# Patient Record
Sex: Female | Born: 1985 | Race: White | Hispanic: No | Marital: Married | Smoking: Never smoker
Health system: Southern US, Community
[De-identification: ages and names within clinical notes are randomized; demographics above are authoritative.]

## PROBLEM LIST (undated history)

## (undated) ENCOUNTER — Inpatient Hospital Stay (HOSPITAL_COMMUNITY): Payer: Self-pay

## (undated) DIAGNOSIS — B999 Unspecified infectious disease: Secondary | ICD-10-CM

## (undated) DIAGNOSIS — Z3490 Encounter for supervision of normal pregnancy, unspecified, unspecified trimester: Secondary | ICD-10-CM

## (undated) DIAGNOSIS — H469 Unspecified optic neuritis: Secondary | ICD-10-CM

## (undated) DIAGNOSIS — D219 Benign neoplasm of connective and other soft tissue, unspecified: Secondary | ICD-10-CM

## (undated) DIAGNOSIS — G35 Multiple sclerosis: Secondary | ICD-10-CM

## (undated) HISTORY — PX: OTHER SURGICAL HISTORY: SHX169

## (undated) HISTORY — PX: WISDOM TOOTH EXTRACTION: SHX21

---

## 2013-05-19 ENCOUNTER — Encounter (HOSPITAL_COMMUNITY): Payer: Self-pay | Admitting: *Deleted

## 2013-05-19 ENCOUNTER — Inpatient Hospital Stay (HOSPITAL_COMMUNITY)
Admission: AD | Admit: 2013-05-19 | Discharge: 2013-05-19 | Disposition: A | Discharge status: Home / Self Care | Payer: BC Managed Care – PPO | Source: Ambulatory Visit | Attending: Obstetrics and Gynecology | Admitting: Obstetrics and Gynecology

## 2013-05-19 DIAGNOSIS — O219 Vomiting of pregnancy, unspecified: Secondary | ICD-10-CM

## 2013-05-19 DIAGNOSIS — O210 Mild hyperemesis gravidarum: Secondary | ICD-10-CM | POA: Insufficient documentation

## 2013-05-19 DIAGNOSIS — R109 Unspecified abdominal pain: Secondary | ICD-10-CM | POA: Insufficient documentation

## 2013-05-19 HISTORY — DX: Unspecified optic neuritis: H46.9

## 2013-05-19 LAB — URINALYSIS, ROUTINE W REFLEX MICROSCOPIC
Ketones, ur: 40 mg/dL — AB
Nitrite: NEGATIVE
Protein, ur: NEGATIVE mg/dL
Urobilinogen, UA: 0.2 mg/dL (ref 0.0–1.0)

## 2013-05-19 MED ORDER — DOXYLAMINE-PYRIDOXINE 10-10 MG PO TBEC: 2.0000 | DELAYED_RELEASE_TABLET | Freq: Every day | ORAL | Status: DC

## 2013-05-19 MED ORDER — LACTATED RINGERS IV SOLN
25.0000 mg | Freq: Once | INTRAVENOUS | Status: AC
  Administered 2013-05-19: 25 mg via INTRAVENOUS
  Filled 2013-05-19: qty 1

## 2013-05-19 MED ORDER — DEXTROSE 5 % IN LACTATED RINGERS IV BOLUS
1000.0000 mL | Freq: Once | INTRAVENOUS | Status: AC
  Administered 2013-05-19: 1000 mL via INTRAVENOUS

## 2013-05-19 MED ORDER — ONDANSETRON 8 MG PO TBDP: 8.0000 mg | ORAL_TABLET | Freq: Three times a day (TID) | ORAL | Status: DC | PRN

## 2013-05-19 MED ORDER — ONDANSETRON 8 MG/NS 50 ML IVPB
8.0000 mg | Freq: Once | INTRAVENOUS | Status: AC
  Administered 2013-05-19: 8 mg via INTRAVENOUS
  Filled 2013-05-19: qty 8

## 2013-05-19 MED ORDER — FAMOTIDINE IN NACL 20-0.9 MG/50ML-% IV SOLN
20.0000 mg | Freq: Once | INTRAVENOUS | Status: AC
  Administered 2013-05-19: 20 mg via INTRAVENOUS
  Filled 2013-05-19: qty 50

## 2013-05-19 MED ORDER — PROMETHAZINE HCL 12.5 MG RE SUPP: 12.5000 mg | Freq: Four times a day (QID) | RECTAL | Status: DC | PRN

## 2013-05-19 NOTE — MAU Note (Signed)
Pt given d/c instructions and verbalized understanding.  

## 2013-05-19 NOTE — MAU Provider Note (Signed)
History     CSN: [id number]  Arrival date and time: 05/19/13 0050   First Provider Initiated Contact with Patient 05/19/13 0156      Chief Complaint  Patient presents with  . Emesis  . Abdominal Pain   HPI  Emily Rhodes is a 27 y.o. G1P0 at [redacted]w[redacted]d who presents today with nausea and vomiting. She states that she has had vomiting for her entire pregnancy so far, but the last 3 days it has gotten worse. She has been taking zofran and phenergan, and states that they are not helping. She last took phenergan around 2100, and promptly threw it up. She is also having a lot of upper abdominal pain. She states that in the past she has taken OTC acid reflux medications that have helped, but she has not taken any recently. She was seen in the office 2 days ago, and diclegis was added to her medications, but she feels that she has been worse since starting that medicine.   Past Medical History  Diagnosis Date  . Optic neuritis     Past Surgical History  Procedure Laterality Date  . Spinal tap      History reviewed. No pertinent family history.  History  Substance Use Topics  . Smoking status: Never Smoker   . Smokeless tobacco: Not on file  . Alcohol Use: No    Allergies: No Known Allergies  Prescriptions prior to admission  Medication Sig Dispense Refill  . cholecalciferol (VITAMIN D) 1000 UNITS tablet Take 1,000 Units by mouth daily.      . ondansetron (ZOFRAN) 4 MG tablet Take 4 mg by mouth every 8 (eight) hours as needed for nausea.      . Prenatal Vit-Fe Fumarate-FA (MULTIVITAMIN-PRENATAL) 27-0.8 MG TABS Take 1 tablet by mouth daily at 12 noon.      . promethazine (PHENERGAN) 25 MG tablet Take 25 mg by mouth every 6 (six) hours as needed for nausea.      . vitamin B-12 (CYANOCOBALAMIN) 1000 MCG tablet Take 1,000 mcg by mouth daily.        ROS Physical Exam   Blood pressure 126/75, pulse 88, temperature 98.7 F (37.1 C), temperature source Oral, resp. rate 20, height 5\' 5"   (1.651 m), weight 92.08 kg (203 lb), last menstrual period 05/19/2013, SpO2 100.00%.  Physical Exam  Nursing note and vitals reviewed. Constitutional: She is oriented to person, place, and time. She appears well-developed and well-nourished.  Cardiovascular: Normal rate.   Respiratory: Effort normal.  GI: Soft. There is no tenderness.  Vomiting while I was in the room.   Neurological: She is alert and oriented to person, place, and time.  Skin: Skin is warm.  Psychiatric: She has a normal mood and affect.    MAU Course  Procedures  Results for orders placed during the hospital encounter of 05/19/13 (from the past 24 hour(s))  URINALYSIS, ROUTINE W REFLEX MICROSCOPIC     Status: Abnormal   Collection Time    05/19/13  1:17 AM      Result Value Range   Color, Urine YELLOW  YELLOW   APPearance CLEAR  CLEAR   Specific Gravity, Urine 1.025  1.005 - 1.030   pH 6.5  5.0 - 8.0   Glucose, UA NEGATIVE  NEGATIVE mg/dL   Hgb urine dipstick NEGATIVE  NEGATIVE   Bilirubin Urine NEGATIVE  NEGATIVE   Ketones, ur 40 (*) NEGATIVE mg/dL   Protein, ur NEGATIVE  NEGATIVE mg/dL   Urobilinogen, UA 0.2  0.0 - 1.0 mg/dL   Nitrite NEGATIVE  NEGATIVE   Leukocytes, UA TRACE (*) NEGATIVE  URINE MICROSCOPIC-ADD ON     Status: Abnormal   Collection Time    05/19/13  1:17 AM      Result Value Range   Squamous Epithelial / LPF FEW (*) RARE   WBC, UA 3-6  <3 WBC/hpf   Bacteria, UA FEW (*) RARE   Urine-Other MUCOUS PRESENT     0210: Spoke with Dr. Senaida Ores. Will IV hydrate and give IV antiemetics. Offer patient ODT and/or rectal phenergan to have at home.  Assessment and Plan   1. Nausea and vomiting in pregnancy prior to [redacted] weeks gestation      Medication List    STOP taking these medications       ondansetron 4 MG tablet  Commonly known as:  ZOFRAN     promethazine 25 MG tablet  Commonly known as:  PHENERGAN  Replaced by:  promethazine 12.5 MG suppository      TAKE these medications        cholecalciferol 1000 UNITS tablet  Commonly known as:  VITAMIN D  Take 1,000 Units by mouth daily.     Doxylamine-Pyridoxine 10-10 MG Tbec  Commonly known as:  DICLEGIS  Take 2 tablets by mouth at bedtime. If symtoms persist add 1 tablet in the morning starting on Day 3. If symptoms persist add 1 tablet in the afternoon on Day 4.     multivitamin-prenatal 27-0.8 MG Tabs  Take 1 tablet by mouth daily at 12 noon.     ondansetron 8 MG disintegrating tablet  Commonly known as:  ZOFRAN ODT  Take 1 tablet (8 mg total) by mouth every 8 (eight) hours as needed for nausea.     promethazine 12.5 MG suppository  Commonly known as:  PHENERGAN  Place 1 suppository (12.5 mg total) rectally every 6 (six) hours as needed for nausea.     vitamin B-12 1000 MCG tablet  Commonly known as:  CYANOCOBALAMIN  Take 1,000 mcg by mouth daily.       Discussed comfort measures and dietary changes Knows to return to MAU as needed if SX worsen or do not improve.   [name] 05/19/2013, 1:57 AM

## 2013-05-19 NOTE — MAU Note (Signed)
Pt reports she has not been vomiting x 3 days , upper abd pain

## 2013-05-20 LAB — URINE CULTURE: Colony Count: 15000

## 2013-05-26 LAB — OB RESULTS CONSOLE HEPATITIS B SURFACE ANTIGEN: Hepatitis B Surface Ag: NEGATIVE

## 2013-05-26 LAB — OB RESULTS CONSOLE RUBELLA ANTIBODY, IGM: Rubella: IMMUNE

## 2013-05-26 LAB — OB RESULTS CONSOLE RPR: RPR: NONREACTIVE

## 2013-05-26 LAB — OB RESULTS CONSOLE ABO/RH: RH TYPE: POSITIVE

## 2013-05-26 LAB — OB RESULTS CONSOLE ANTIBODY SCREEN: Antibody Screen: NEGATIVE

## 2013-05-26 LAB — OB RESULTS CONSOLE HIV ANTIBODY (ROUTINE TESTING): HIV: NONREACTIVE

## 2013-06-26 LAB — OB RESULTS CONSOLE GC/CHLAMYDIA
Chlamydia: NEGATIVE
Gonorrhea: NEGATIVE

## 2013-11-24 LAB — OB RESULTS CONSOLE GBS: GBS: POSITIVE

## 2013-11-30 ENCOUNTER — Encounter (HOSPITAL_COMMUNITY): Payer: Self-pay | Admitting: General Practice

## 2013-11-30 ENCOUNTER — Inpatient Hospital Stay (HOSPITAL_COMMUNITY)
Admission: AD | Admit: 2013-11-30 | Discharge: 2013-12-02 | DRG: 781 | Disposition: A | Discharge status: Home / Self Care | Payer: BC Managed Care – PPO | Source: Ambulatory Visit | Attending: Obstetrics and Gynecology | Admitting: Obstetrics and Gynecology

## 2013-11-30 ENCOUNTER — Inpatient Hospital Stay (HOSPITAL_COMMUNITY): Payer: BC Managed Care – PPO

## 2013-11-30 DIAGNOSIS — Z22330 Carrier of Group B streptococcus: Secondary | ICD-10-CM

## 2013-11-30 DIAGNOSIS — O9989 Other specified diseases and conditions complicating pregnancy, childbirth and the puerperium: Secondary | ICD-10-CM

## 2013-11-30 DIAGNOSIS — O99891 Other specified diseases and conditions complicating pregnancy: Secondary | ICD-10-CM | POA: Diagnosis present

## 2013-11-30 DIAGNOSIS — O3410 Maternal care for benign tumor of corpus uteri, unspecified trimester: Secondary | ICD-10-CM | POA: Diagnosis present

## 2013-11-30 DIAGNOSIS — M549 Dorsalgia, unspecified: Secondary | ICD-10-CM | POA: Diagnosis present

## 2013-11-30 DIAGNOSIS — O4700 False labor before 37 completed weeks of gestation, unspecified trimester: Secondary | ICD-10-CM | POA: Diagnosis present

## 2013-11-30 DIAGNOSIS — O2390 Unspecified genitourinary tract infection in pregnancy, unspecified trimester: Principal | ICD-10-CM

## 2013-11-30 DIAGNOSIS — O2303 Infections of kidney in pregnancy, third trimester: Secondary | ICD-10-CM | POA: Diagnosis present

## 2013-11-30 DIAGNOSIS — N12 Tubulo-interstitial nephritis, not specified as acute or chronic: Secondary | ICD-10-CM

## 2013-11-30 DIAGNOSIS — D259 Leiomyoma of uterus, unspecified: Secondary | ICD-10-CM | POA: Diagnosis present

## 2013-11-30 LAB — URINALYSIS, ROUTINE W REFLEX MICROSCOPIC
GLUCOSE, UA: NEGATIVE mg/dL
Ketones, ur: 40 mg/dL — AB
Leukocytes, UA: NEGATIVE
Nitrite: NEGATIVE
PH: 5.5 (ref 5.0–8.0)
Protein, ur: 30 mg/dL — AB
UROBILINOGEN UA: 0.2 mg/dL (ref 0.0–1.0)

## 2013-11-30 LAB — CBC WITH DIFFERENTIAL/PLATELET
Basophils Absolute: 0 10*3/uL (ref 0.0–0.1)
Basophils Relative: 0 % (ref 0–1)
EOS PCT: 0 % (ref 0–5)
Eosinophils Absolute: 0 10*3/uL (ref 0.0–0.7)
HCT: 33.9 % — ABNORMAL LOW (ref 36.0–46.0)
HEMOGLOBIN: 11.3 g/dL — AB (ref 12.0–15.0)
LYMPHS ABS: 0.9 10*3/uL (ref 0.7–4.0)
LYMPHS PCT: 6 % — AB (ref 12–46)
MCH: 26 pg (ref 26.0–34.0)
MCHC: 33.3 g/dL (ref 30.0–36.0)
MCV: 78.1 fL (ref 78.0–100.0)
MONOS PCT: 3 % (ref 3–12)
Monocytes Absolute: 0.4 10*3/uL (ref 0.1–1.0)
Neutro Abs: 12.9 10*3/uL — ABNORMAL HIGH (ref 1.7–7.7)
Neutrophils Relative %: 91 % — ABNORMAL HIGH (ref 43–77)
PLATELETS: 249 10*3/uL (ref 150–400)
RBC: 4.34 MIL/uL (ref 3.87–5.11)
RDW: 13.4 % (ref 11.5–15.5)
WBC: 14.2 10*3/uL — AB (ref 4.0–10.5)

## 2013-11-30 LAB — URINE MICROSCOPIC-ADD ON

## 2013-11-30 LAB — RPR: RPR: NONREACTIVE

## 2013-11-30 LAB — TYPE AND SCREEN
ABO/RH(D): A POS
Antibody Screen: NEGATIVE

## 2013-11-30 LAB — ABO/RH: ABO/RH(D): A POS

## 2013-11-30 MED ORDER — ZOLPIDEM TARTRATE 5 MG PO TABS
5.0000 mg | ORAL_TABLET | Freq: Every evening | ORAL | Status: DC | PRN
  Administered 2013-11-30: 5 mg via ORAL
  Filled 2013-11-30: qty 1

## 2013-11-30 MED ORDER — PRENATAL MULTIVITAMIN CH
1.0000 | ORAL_TABLET | Freq: Every day | ORAL | Status: DC
  Filled 2013-11-30 (×2): qty 1

## 2013-11-30 MED ORDER — LACTATED RINGERS IV SOLN
INTRAVENOUS | Status: DC
  Administered 2013-11-30: 125 mL/h via INTRAVENOUS
  Administered 2013-12-01: 11:00:00 via INTRAVENOUS

## 2013-11-30 MED ORDER — FENTANYL CITRATE 0.05 MG/ML IJ SOLN
50.0000 ug | INTRAMUSCULAR | Status: DC | PRN
  Administered 2013-11-30: 50 ug via INTRAVENOUS
  Filled 2013-11-30: qty 2

## 2013-11-30 MED ORDER — COMPLETENATE 29-1 MG PO CHEW
1.0000 | CHEWABLE_TABLET | Freq: Every day | ORAL | Status: DC
  Filled 2013-11-30 (×3): qty 1

## 2013-11-30 MED ORDER — PROMETHAZINE HCL 25 MG/ML IJ SOLN
12.5000 mg | Freq: Once | INTRAMUSCULAR | Status: AC
  Administered 2013-11-30: 12.5 mg via INTRAVENOUS
  Filled 2013-11-30: qty 1

## 2013-11-30 MED ORDER — LACTATED RINGERS IV SOLN
Freq: Once | INTRAVENOUS | Status: AC
  Administered 2013-11-30: 12:00:00 via INTRAVENOUS

## 2013-11-30 MED ORDER — ACETAMINOPHEN 325 MG PO TABS: 650.0000 mg | ORAL_TABLET | ORAL | Status: DC | PRN

## 2013-11-30 MED ORDER — PROMETHAZINE HCL 25 MG/ML IJ SOLN
12.5000 mg | INTRAMUSCULAR | Status: DC | PRN
  Administered 2013-11-30 – 2013-12-01 (×4): 12.5 mg via INTRAVENOUS
  Filled 2013-11-30 (×4): qty 1

## 2013-11-30 MED ORDER — HYDROMORPHONE HCL PF 1 MG/ML IJ SOLN
1.0000 mg | INTRAMUSCULAR | Status: DC | PRN
  Administered 2013-11-30 – 2013-12-02 (×9): 1 mg via INTRAVENOUS
  Filled 2013-11-30 (×9): qty 1

## 2013-11-30 MED ORDER — DOCUSATE SODIUM 100 MG PO CAPS
100.0000 mg | ORAL_CAPSULE | Freq: Every day | ORAL | Status: DC
Start: 2013-11-30 — End: 2013-12-02
  Administered 2013-12-01: 100 mg via ORAL
  Filled 2013-11-30 (×4): qty 1

## 2013-11-30 MED ORDER — HYDROMORPHONE HCL PF 1 MG/ML IJ SOLN
1.0000 mg | Freq: Once | INTRAMUSCULAR | Status: AC
  Administered 2013-11-30: 1 mg via INTRAVENOUS
  Filled 2013-11-30: qty 1

## 2013-11-30 MED ORDER — CALCIUM CARBONATE ANTACID 500 MG PO CHEW
2.0000 | CHEWABLE_TABLET | ORAL | Status: DC | PRN
  Administered 2013-12-01: 400 mg via ORAL
  Filled 2013-11-30: qty 1
  Filled 2013-11-30: qty 2

## 2013-11-30 MED ORDER — HYDROMORPHONE HCL PF 10 MG/ML IJ SOLN: 1.0000 mg/h | INTRAMUSCULAR | Status: DC | PRN

## 2013-11-30 MED ORDER — DEXTROSE 5 % IV SOLN
1.0000 g | Freq: Two times a day (BID) | INTRAVENOUS | Status: DC
  Administered 2013-11-30 – 2013-12-02 (×4): 1 g via INTRAVENOUS
  Filled 2013-11-30 (×5): qty 10

## 2013-11-30 NOTE — MAU Provider Note (Signed)
History     CSN: [id number]  Arrival date and time: 11/30/13 1056   First Provider Initiated Contact with Patient 11/30/13 1145      Chief Complaint  Patient presents with  . Flank Pain  . Nausea  . Emesis   HPI This is a 28 y.o. female at [redacted]w[redacted]d who presents with c/o Left flank pain, nause and vomiting.  Also has some abdominal tightness, but no abdominal pain. Denies fever or bleeding. Sent for evaluation by Dr Melba Coon.  RN Note: Pt sent over to MAU by Dr. Melba Coon d/t c/o left sided back pain, nausea and vomiting, and all over abdominal discomfort she describes as tightness.      OB History   Grav Para Term Preterm Abortions TAB SAB Ect Mult Living   1               Past Medical History  Diagnosis Date  . Optic neuritis     Past Surgical History  Procedure Laterality Date  . Spinal tap      History reviewed. No pertinent family history.  History  Substance Use Topics  . Smoking status: Never Smoker   . Smokeless tobacco: Not on file  . Alcohol Use: No    Allergies: No Known Allergies  Prescriptions prior to admission  Medication Sig Dispense Refill  . ferrous sulfate 325 (65 FE) MG tablet Take 325 mg by mouth daily with breakfast.      . omeprazole (PRILOSEC) 40 MG capsule Take 40 mg by mouth daily.        Review of Systems  Constitutional: Negative for fever and chills.  Gastrointestinal: Positive for nausea, vomiting and abdominal pain. Negative for diarrhea and constipation.  Musculoskeletal: Positive for back pain (left flank).  Neurological: Negative for headaches.   Physical Exam   Blood pressure 122/70, pulse 71, temperature 97.7 F (36.5 C), temperature source Oral, resp. rate 20, height 5\' 6"  (1.676 m), weight 105.688 kg (233 lb), last menstrual period 05/19/2013.  Physical Exam  Constitutional: She is oriented to person, place, and time. She appears well-developed and well-nourished. No distress.  HENT:  Head: Normocephalic.   Cardiovascular: Normal rate.   Respiratory: Effort normal.  GI: Soft. She exhibits no distension. There is no tenderness. There is no rebound and no guarding.  + CVA tenderness on left mid back, more so under CVA, below ribcage   Musculoskeletal: Normal range of motion.  Neurological: She is alert and oriented to person, place, and time.  Skin: Skin is warm and dry.  Psychiatric: She has a normal mood and affect.   Fetal heart rate reactive Irregular contractions  MAU Course  Procedures  MDM CBC, diff, IV hydration, analgesia and Renal US. Results for orders placed during the hospital encounter of 11/30/13 (from the past 72 hour(s))  URINALYSIS, ROUTINE W REFLEX MICROSCOPIC     Status: Abnormal   Collection Time    11/30/13 11:40 AM      Result Value Range   Color, Urine YELLOW  YELLOW   APPearance CLEAR  CLEAR   Specific Gravity, Urine >1.030 (*) 1.005 - 1.030   pH 5.5  5.0 - 8.0   Glucose, UA NEGATIVE  NEGATIVE mg/dL   Hgb urine dipstick LARGE (*) NEGATIVE   Bilirubin Urine SMALL (*) NEGATIVE   Ketones, ur 40 (*) NEGATIVE mg/dL   Protein, ur 30 (*) NEGATIVE mg/dL   Urobilinogen, UA 0.2  0.0 - 1.0 mg/dL   Nitrite NEGATIVE  NEGATIVE  Leukocytes, UA NEGATIVE  NEGATIVE  URINE MICROSCOPIC-ADD ON     Status: Abnormal   Collection Time    11/30/13 11:40 AM      Result Value Range   Squamous Epithelial / LPF MANY (*) RARE   WBC, UA 0-2  <3 WBC/hpf   RBC / HPF 7-10  <3 RBC/hpf   Bacteria, UA MANY (*) RARE   Crystals CA OXALATE CRYSTALS (*) NEGATIVE   Urine-Other MUCOUS PRESENT     Comment: AMORPHOUS URATES/PHOSPHATES  CBC WITH DIFFERENTIAL     Status: Abnormal   Collection Time    11/30/13 12:05 PM      Result Value Range   WBC 14.2 (*) 4.0 - 10.5 K/uL   RBC 4.34  3.87 - 5.11 MIL/uL   Hemoglobin 11.3 (*) 12.0 - 15.0 g/dL   HCT 33.9 (*) 36.0 - 46.0 %   MCV 78.1  78.0 - 100.0 fL   MCH 26.0  26.0 - 34.0 pg   MCHC 33.3  30.0 - 36.0 g/dL   RDW 13.4  11.5 - 15.5 %    Platelets 249  150 - 400 K/uL   Neutrophils Relative % 91 (*) 43 - 77 %   Neutro Abs 12.9 (*) 1.7 - 7.7 K/uL   Lymphocytes Relative 6 (*) 12 - 46 %   Lymphs Abs 0.9  0.7 - 4.0 K/uL   Monocytes Relative 3  3 - 12 %   Monocytes Absolute 0.4  0.1 - 1.0 K/uL   Eosinophils Relative 0  0 - 5 %   Eosinophils Absolute 0.0  0.0 - 0.7 K/uL   Basophils Relative 0  0 - 1 %   Basophils Absolute 0.0  0.0 - 0.1 K/uL   US Renal  11/30/2013   CLINICAL DATA:  Left flank pain.  Thirty-three weeks pregnant.  EXAM: RENAL/URINARY TRACT ULTRASOUND COMPLETE  COMPARISON:  None.  FINDINGS: Right Kidney:  Length: 13.1 cm. Echogenicity within normal limits. No mass or hydronephrosis visualized.  Left Kidney:  Length: 13.2 cm. Echogenicity within normal limits. No mass or hydronephrosis visualized.  Bladder:  Urinary bladder is not well visualized. Intrauterine fetus is noted in cephalic presentation.  IMPRESSION: Normal sonographic appearance of both kidneys. No evidence hydronephrosis.   Electronically Signed   By: Earle Gell M.D.   On: 11/30/2013 13:31    Assessment and Plan  A:  SIUP at [redacted]w[redacted]d      Presumed Pyelonephritis  P:  Admit per Dr Melba Coon       Rocephin  Hansel Feinstein 11/30/2013, 12:11 PM

## 2013-11-30 NOTE — MAU Note (Signed)
Pt sent over to MAU by Dr. Melba Coon d/t c/o left sided back pain, nausea and vomiting, and all over abdominal discomfort she describes as tightness.

## 2013-11-30 NOTE — H&P (Signed)
Emily Rhodes is a 28 y.o. female G1P0 at 51 wk, .who presents to the office with crampy, colicky back pain since this am.  Also irregular runs of ctx.  +FM, no LOF, no VB.  PNC relatively uncomplicated - pt with optic neuritis, negative MRI, ? MS negative w/u.  MD at [facility] ok'd to push.  Also has small 2x3 cm anterior fibroid.  In MAU many bacteria in urine, cx P.  Blood in urine in office.    Maternal Medical History:  Contractions: Frequency: irregular.    Fetal activity: Perceived fetal activity is normal.    Prenatal Complications - Diabetes: none.    OB History   Grav Para Term Preterm Abortions TAB SAB Ect Mult Living   1 0 0 0 0 0 0 0 0 0     G1 present No abn pap, no STD  Past Medical History  Diagnosis Date  . Optic neuritis    Past Surgical History  Procedure Laterality Date  . Spinal tap     Family History: family history is not on file. Social History:  reports that she has never smoked. She does not have any smokeless tobacco history on file. She reports that she does not drink alcohol or use illicit drugs. Meds: PNV All: Bee venom, Tape, NKDA   Prenatal Transfer Tool  Maternal Diabetes: No Genetic Screening: Declined Maternal Ultrasounds/Referrals: Normal Fetal Ultrasounds or other Referrals:  None Maternal Substance Abuse:  No Significant Maternal Medications:  None Significant Maternal Lab Results:  Lab values include: Group B Strep positive Other Comments:  failed 1hr glucola, nl 3 hr GTT  Review of Systems  HENT: Negative.   Eyes: Negative.   Respiratory: Negative.   Cardiovascular: Negative.   Gastrointestinal: Negative.   Genitourinary: Positive for flank pain.  Musculoskeletal: Positive for myalgias.  Skin: Negative.   Neurological: Negative.   Psychiatric/Behavioral: Negative.       Blood pressure 122/68, pulse 92, temperature 99.4 F (37.4 C), temperature source Oral, resp. rate 20, height 5\' 6"  (1.676 m), weight 105.688 kg (233 lb), last  menstrual period 05/19/2013. Maternal Exam:  Uterine Assessment: Contraction frequency is irregular.   Abdomen: Fundal height is appropriate forgestation.   Estimated fetal weight is 5-6#.   Fetal presentation: vertex  Introitus: Normal vulva. Normal vagina.  Pelvis: adequate for delivery.   Cervix: Cervix evaluated by digital exam.     Physical Exam  Constitutional: She is oriented to person, place, and time. She appears well-developed and well-nourished.  HENT:  Head: Normocephalic and atraumatic.  Cardiovascular: Normal rate and regular rhythm.   Respiratory: Effort normal and breath sounds normal. No respiratory distress. She has no wheezes.  GI: Soft. Bowel sounds are normal. She exhibits no distension. There is no tenderness.  CVAT  Musculoskeletal: Normal range of motion.  Neurological: She is alert and oriented to person, place, and time.  Skin: Skin is warm and dry.  Psychiatric: She has a normal mood and affect. Her behavior is normal.    Prenatal labs: ABO, Rh: --/--/A POS, A POS (01/22 1536) Antibody: NEG (01/22 1536) Rubella: Immune (07/18 0000) RPR: Nonreactive (07/18 0000)  HBsAg: Negative (07/18 0000)  HIV: Non-reactive (07/18 0000)  GBS: Positive (01/16 0000)   Hgb 12.6/Pap WNL/Ur Cx neg/GCneg/ Chl neg/ glucola 147/ nl 3 hr GTT/ Plt 264K  [place] nl anat, ant plac, cwd, female, 2x3 ant fibroid. Assessment/Plan: 27yo G1P0 at 15 with pyelo admitted for abx and pain management Dilaudid 1 q 3hr Rocephin  1 q 12 Close monitoring   BOVARD,Quinita Kostelecky 11/30/2013, 8:10 PM

## 2013-12-01 MED ORDER — SODIUM CHLORIDE 0.9 % IV SOLN
INTRAVENOUS | Status: DC
  Administered 2013-12-01: 19:00:00 via INTRAVENOUS

## 2013-12-01 NOTE — Progress Notes (Signed)
Pt off the monitor after reassurring FHR  

## 2013-12-01 NOTE — Progress Notes (Signed)
Pt off the monitor.

## 2013-12-01 NOTE — Progress Notes (Signed)
Patient ID: Emily Rhodes, female   DOB: [birth year], 28 y.o.   MRN: [id number]  +FM, no LOF, no VB, occ ctx; intermittent, colicky back pain - helped with dilaudid.  AFVSS ( Tm =100.1 ) gen NAD FHTs 140's mod var, category 1 toco occ, runs  SVE 1.5  27yo G1P0 at 36+ with likely pyelo being treated with pyelo with Rocephin Dilaudid for pain Continue close monitoring

## 2013-12-02 MED ORDER — CEPHALEXIN 500 MG PO CAPS: 500.0000 mg | ORAL_CAPSULE | Freq: Three times a day (TID) | ORAL | Status: DC

## 2013-12-02 MED ORDER — OXYCODONE-ACETAMINOPHEN 5-325 MG PO TABS: 1.0000 | ORAL_TABLET | Freq: Four times a day (QID) | ORAL | Status: DC | PRN

## 2013-12-02 NOTE — Progress Notes (Signed)
Pt given discharge instructions and verlbalizes understanding

## 2013-12-02 NOTE — Discharge Summary (Signed)
Physician Discharge Summary  Patient ID: Emily Rhodes MRN: [id number] DOB/AGE: 04-May-1986 27 y.o.  Admit date: 11/30/2013 Discharge date: 12/02/2013  Admission Diagnoses:  IUP at 36+ weeks, possible pyelonephritis  Discharge Diagnoses:  Same Active Problems:   Pyelonephritis complicating pregnancy in third trimester, antepartum   Discharged Condition: good  Hospital Course: Pt admitted by Dr. Melba Coon with left back/flank pain and treated for possible pyelonephritis with Rocephin.  Tmax 100.1, no urine culture in computer.  Pain gradually improved, felt to be stable enough to continue antibiotics and pain meds at home.  Baby reactive throughout stay.  Consults: None  Significant Diagnostic Studies: radiology: Ultrasound: normal renal ultrasound  Treatments: IV hydration and antibiotics: ceftriaxone  Discharge Exam: Blood pressure 117/72, pulse 95, temperature 99 F (37.2 C), temperature source Oral, resp. rate 18, height 5\' 6"  (1.676 m), weight 105.688 kg (233 lb), last menstrual period 05/19/2013. General appearance: alert  Disposition: 01-Home or Self Care  Discharge Orders   Future Orders Complete By Expires   Discharge activity:  No Restrictions  As directed    Discharge diet:  No restrictions  As directed    Fetal Kick Count:  Lie on our left side for one hour after a meal, and count the number of times your baby kicks.  If it is less than 5 times, get up, move around and drink some juice.  Repeat the test 30 minutes later.  If it is still less than 5 kicks in an hour, notify your doctor.  As directed    No sexual activity restrictions  As directed        Medication List         cephALEXin 500 MG capsule  Commonly known as:  KEFLEX  Take 1 capsule (500 mg total) by mouth 3 (three) times daily.     ferrous sulfate 325 (65 FE) MG tablet  Take 325 mg by mouth daily with breakfast.     omeprazole 40 MG capsule  Commonly known as:  PRILOSEC  Take 40 mg by mouth daily.      oxyCODONE-acetaminophen 5-325 MG per tablet  Commonly known as:  ROXICET  Take 1-2 tablets by mouth every 6 (six) hours as needed for severe pain.           Follow-up Information   Please follow up. (As scheduled)       Signed: Ulyses Panico D 12/02/2013, 10:26 AM

## 2013-12-02 NOTE — Progress Notes (Signed)
HD #3, #[redacted]W[redacted]D, ?pyelo  Feels better, still some left low back pain with activity Afeb, VSS FHT- Reactive NSTs Fundus NT Back- no CVAT, tender left lower back/flank It does not appear that a urine culture was done Will d/c home on Keflex to finish 7 day course of antibiotics to cover for possible pyelo, continue Percocet prn

## 2013-12-02 NOTE — Progress Notes (Signed)
Pt off the monitor after reassurring FHr

## 2013-12-02 NOTE — Discharge Instructions (Signed)
Call for increasing pain or temp >100.3

## 2013-12-04 ENCOUNTER — Encounter (HOSPITAL_COMMUNITY): Payer: Self-pay | Admitting: *Deleted

## 2013-12-04 ENCOUNTER — Inpatient Hospital Stay (HOSPITAL_COMMUNITY)
Admission: AD | Admit: 2013-12-04 | Discharge: 2013-12-05 | DRG: 781 | Disposition: A | Discharge status: Home / Self Care | Payer: BC Managed Care – PPO | Source: Ambulatory Visit | Attending: Obstetrics and Gynecology | Admitting: Obstetrics and Gynecology

## 2013-12-04 ENCOUNTER — Inpatient Hospital Stay (HOSPITAL_COMMUNITY): Payer: BC Managed Care – PPO

## 2013-12-04 DIAGNOSIS — O4700 False labor before 37 completed weeks of gestation, unspecified trimester: Secondary | ICD-10-CM | POA: Diagnosis present

## 2013-12-04 DIAGNOSIS — O2390 Unspecified genitourinary tract infection in pregnancy, unspecified trimester: Principal | ICD-10-CM | POA: Diagnosis present

## 2013-12-04 DIAGNOSIS — O2303 Infections of kidney in pregnancy, third trimester: Secondary | ICD-10-CM

## 2013-12-04 DIAGNOSIS — R1031 Right lower quadrant pain: Secondary | ICD-10-CM | POA: Diagnosis present

## 2013-12-04 DIAGNOSIS — O26839 Pregnancy related renal disease, unspecified trimester: Secondary | ICD-10-CM | POA: Diagnosis present

## 2013-12-04 DIAGNOSIS — N1330 Unspecified hydronephrosis: Secondary | ICD-10-CM | POA: Diagnosis present

## 2013-12-04 DIAGNOSIS — N12 Tubulo-interstitial nephritis, not specified as acute or chronic: Secondary | ICD-10-CM | POA: Diagnosis present

## 2013-12-04 HISTORY — DX: Unspecified infectious disease: B99.9

## 2013-12-04 HISTORY — DX: Benign neoplasm of connective and other soft tissue, unspecified: D21.9

## 2013-12-04 LAB — URINALYSIS, ROUTINE W REFLEX MICROSCOPIC
BILIRUBIN URINE: NEGATIVE
Glucose, UA: NEGATIVE mg/dL
Ketones, ur: NEGATIVE mg/dL
Nitrite: NEGATIVE
PH: 6 (ref 5.0–8.0)
PROTEIN: NEGATIVE mg/dL
Specific Gravity, Urine: >1.03 — ABNORMAL HIGH (ref 1.005–1.030)
Urobilinogen, UA: 1 mg/dL (ref 0.0–1.0)

## 2013-12-04 LAB — URINE MICROSCOPIC-ADD ON

## 2013-12-04 LAB — COMPREHENSIVE METABOLIC PANEL
ALK PHOS: 168 U/L — AB (ref 39–117)
ALT: 9 U/L (ref 0–35)
AST: 13 U/L (ref 0–37)
Albumin: 2.6 g/dL — ABNORMAL LOW (ref 3.5–5.2)
BILIRUBIN TOTAL: 0.4 mg/dL (ref 0.3–1.2)
BUN: 6 mg/dL (ref 6–23)
CHLORIDE: 102 meq/L (ref 96–112)
CO2: 24 meq/L (ref 19–32)
Calcium: 9.4 mg/dL (ref 8.4–10.5)
Creatinine, Ser: 0.85 mg/dL (ref 0.50–1.10)
GLUCOSE: 83 mg/dL (ref 70–99)
POTASSIUM: 4.7 meq/L (ref 3.7–5.3)
Sodium: 140 mEq/L (ref 137–147)
TOTAL PROTEIN: 6.6 g/dL (ref 6.0–8.3)

## 2013-12-04 LAB — CBC
HEMATOCRIT: 33.7 % — AB (ref 36.0–46.0)
HEMOGLOBIN: 11 g/dL — AB (ref 12.0–15.0)
MCH: 25.5 pg — ABNORMAL LOW (ref 26.0–34.0)
MCHC: 32.6 g/dL (ref 30.0–36.0)
MCV: 78.2 fL (ref 78.0–100.0)
Platelets: 273 10*3/uL (ref 150–400)
RBC: 4.31 MIL/uL (ref 3.87–5.11)
RDW: 13.8 % (ref 11.5–15.5)
WBC: 11.3 10*3/uL — AB (ref 4.0–10.5)

## 2013-12-04 MED ORDER — ACETAMINOPHEN 325 MG PO TABS: 650.0000 mg | ORAL_TABLET | ORAL | Status: DC | PRN

## 2013-12-04 MED ORDER — PANTOPRAZOLE SODIUM 40 MG PO TBEC
40.0000 mg | DELAYED_RELEASE_TABLET | Freq: Every day | ORAL | Status: DC
  Administered 2013-12-04 – 2013-12-05 (×2): 40 mg via ORAL
  Filled 2013-12-04 (×4): qty 1

## 2013-12-04 MED ORDER — PRENATAL MULTIVITAMIN CH
1.0000 | ORAL_TABLET | Freq: Every day | ORAL | Status: DC
  Administered 2013-12-05: 1 via ORAL
  Filled 2013-12-04 (×2): qty 1

## 2013-12-04 MED ORDER — LACTATED RINGERS IV BOLUS (SEPSIS)
1000.0000 mL | Freq: Once | INTRAVENOUS | Status: AC
  Administered 2013-12-04: 1000 mL via INTRAVENOUS

## 2013-12-04 MED ORDER — HYDROMORPHONE HCL 2 MG PO TABS
2.0000 mg | ORAL_TABLET | ORAL | Status: DC | PRN
  Administered 2013-12-04 – 2013-12-05 (×3): 2 mg via ORAL
  Filled 2013-12-04 (×3): qty 1

## 2013-12-04 MED ORDER — LACTATED RINGERS IV BOLUS (SEPSIS): 1000.0000 mL | Freq: Once | INTRAVENOUS | Status: DC

## 2013-12-04 MED ORDER — ONDANSETRON HCL 4 MG/2ML IJ SOLN
4.0000 mg | Freq: Four times a day (QID) | INTRAMUSCULAR | Status: DC | PRN
  Administered 2013-12-04: 4 mg via INTRAVENOUS
  Filled 2013-12-04: qty 2

## 2013-12-04 MED ORDER — HYDROMORPHONE HCL 2 MG PO TABS
2.0000 mg | ORAL_TABLET | Freq: Once | ORAL | Status: AC
  Administered 2013-12-04: 2 mg via ORAL
  Filled 2013-12-04: qty 1

## 2013-12-04 MED ORDER — ZOLPIDEM TARTRATE 5 MG PO TABS: 5.0000 mg | ORAL_TABLET | Freq: Every evening | ORAL | Status: DC | PRN

## 2013-12-04 MED ORDER — CALCIUM CARBONATE ANTACID 500 MG PO CHEW
2.0000 | CHEWABLE_TABLET | ORAL | Status: DC | PRN
  Filled 2013-12-04: qty 2

## 2013-12-04 MED ORDER — GENTAMICIN SULFATE 40 MG/ML IJ SOLN
180.0000 mg | Freq: Three times a day (TID) | INTRAVENOUS | Status: DC
  Administered 2013-12-05: 180 mg via INTRAVENOUS
  Filled 2013-12-04 (×2): qty 4.5

## 2013-12-04 MED ORDER — DEXTROSE-NACL 5-0.45 % IV SOLN
INTRAVENOUS | Status: DC
  Administered 2013-12-04 – 2013-12-05 (×2): via INTRAVENOUS

## 2013-12-04 MED ORDER — MAGNESIUM HYDROXIDE 400 MG/5ML PO SUSP
30.0000 mL | Freq: Every day | ORAL | Status: DC | PRN
  Administered 2013-12-04: 30 mL via ORAL
  Filled 2013-12-04: qty 30

## 2013-12-04 MED ORDER — GENTAMICIN SULFATE 40 MG/ML IJ SOLN
5.0000 mg/kg | INTRAMUSCULAR | Status: DC
  Administered 2013-12-04: 390 mg via INTRAVENOUS
  Filled 2013-12-04: qty 9.75

## 2013-12-04 MED ORDER — DOCUSATE SODIUM 100 MG PO CAPS
100.0000 mg | ORAL_CAPSULE | Freq: Every day | ORAL | Status: DC
  Administered 2013-12-05: 100 mg via ORAL
  Filled 2013-12-04 (×3): qty 1

## 2013-12-04 MED ORDER — ONDANSETRON HCL 4 MG/2ML IJ SOLN
4.0000 mg | Freq: Once | INTRAMUSCULAR | Status: AC
  Administered 2013-12-04: 4 mg via INTRAVENOUS
  Filled 2013-12-04: qty 2

## 2013-12-04 NOTE — Progress Notes (Signed)
ANTIBIOTIC CONSULT NOTE - INITIAL  Pharmacy Consult for Gentamicin Indication: possible pyelonephritis  Allergies  Allergen Reactions  . Bee Venom Swelling  . Tape Itching and Rash    Patient Measurements: Height: 5' 6.14" (168 cm) Weight: 233 lb 0.4 oz (105.7 kg) IBW/kg (Calculated) : 59.63 Adjusted Body Weight: 73.2 kg  Vital Signs: Temp: 97.7 F (36.5 C) (01/26 1716) Temp src: Oral (01/26 1716) BP: 128/72 mmHg (01/26 1827) Pulse Rate: 84 (01/26 1827)  Labs:  Recent Labs  12/04/13 1216  WBC 11.3*  HGB 11.0*  PLT 273  CREATININE 0.85   No results found for this basename: GENTTROUGH, GENTPEAK, GENTRANDOM,  in the last 72 hours   Microbiology: Recent Results (from the past 720 hour(s))  OB RESULTS CONSOLE GBS     Status: None   Collection Time    11/24/13 12:00 AM      Result Value Range Status   GBS Positive   Final    Medications:  Received gentamicin 390 mg IV x 1 at 1413 today  Assessment: 28 y.o. female G1P0000 at [redacted]w[redacted]d  Estimated Ke = 0.285, Vd = 29.3  Goal of Therapy:  Gentamicin peak 6-8 mg/L and Trough < 1 mg/L  Plan:  - Gentamicin 180 mg IV every 8 hrs -- starting 24h after first dose, due 12/05/13 at 1400 - Will check gentamicin levels if continued > 72hr or clinically indicated  Braysen Cloward L. Amada Jupiter, PharmD, [place] Clinical Pharmacist Pager: [phone] Pharmacy: [phone] 12/04/2013 6:41 PM

## 2013-12-04 NOTE — MAU Note (Addendum)
Back pain started last Thurs, when have brief periods where it seemed to be getting better and then it would come right back.  +left CVA tenderness

## 2013-12-04 NOTE — Plan of Care (Signed)
Problem: Consults Goal: Birthing Suites Patient Information Press F2 to bring up selections list   Pt < [redacted] weeks EGA     

## 2013-12-04 NOTE — MAU Provider Note (Signed)
History     CSN: [id number]  Arrival date and time: 12/04/13 1209   First Provider Initiated Contact with Patient 12/04/13 1251      Chief Complaint  Patient presents with  . Back Pain   Back Pain    Emily Rhodes is a 28 y.o. G1P0000 at [redacted]w[redacted]d who presents today with continued left flank pain and RLQ pain. She has not been able to keep down pain meds at home. She states that she vomits when she takes percocet. She denies any VB or LOF, and confirms fetal movement. She states that the pain is constant, and then will periodically get much worse.   Past Medical History  Diagnosis Date  . Optic neuritis     rt eye  . Infection     UTI  . Fibroid     Past Surgical History  Procedure Laterality Date  . Spinal tap    . Wisdom tooth extraction      age 36    Family History  Problem Relation Age of Onset  . Diabetes Father   . Hypertension Father   . Hearing loss Neg Hx     History  Substance Use Topics  . Smoking status: Never Smoker   . Smokeless tobacco: Never Used  . Alcohol Use: No    Allergies:  Allergies  Allergen Reactions  . Bee Venom Swelling  . Tape Itching and Rash    Prescriptions prior to admission  Medication Sig Dispense Refill  . cephALEXin (KEFLEX) 500 MG capsule Take 1 capsule (500 mg total) by mouth 3 (three) times daily.  21 capsule  0  . ferrous sulfate 325 (65 FE) MG tablet Take 325 mg by mouth daily with breakfast.      . omeprazole (PRILOSEC) 40 MG capsule Take 40 mg by mouth daily.      Marland Kitchen oxyCODONE-acetaminophen (ROXICET) 5-325 MG per tablet Take 1-2 tablets by mouth every 6 (six) hours as needed for severe pain.  30 tablet  0    Review of Systems  Musculoskeletal: Positive for back pain.   Physical Exam   Blood pressure 135/78, pulse 99, temperature 98.1 F (36.7 C), temperature source Oral, resp. rate 20, last menstrual period 05/19/2013, SpO2 97.00%.  Physical Exam  Nursing note and vitals reviewed. Constitutional: She is  oriented to person, place, and time. She appears well-developed and well-nourished. No distress.  Cardiovascular: Normal rate.   Respiratory: Effort normal.  GI: Soft. There is no tenderness. There is no rebound.  Genitourinary:  +CVA tenderness on the Left - CVA tenderness on the right  Cervix: 2/thick/-2   Neurological: She is alert and oriented to person, place, and time.  Skin: Skin is warm and dry.  Psychiatric: She has a normal mood and affect.   FHT: 140, moderate with 15x15 accels, no decels Toco: q 2 mins  MAU Course  Procedures  Results for orders placed during the hospital encounter of 12/04/13 (from the past 24 hour(s))  CBC     Status: Abnormal   Collection Time    12/04/13 12:16 PM      Result Value Range   WBC 11.3 (*) 4.0 - 10.5 K/uL   RBC 4.31  3.87 - 5.11 MIL/uL   Hemoglobin 11.0 (*) 12.0 - 15.0 g/dL   HCT 33.7 (*) 36.0 - 46.0 %   MCV 78.2  78.0 - 100.0 fL   MCH 25.5 (*) 26.0 - 34.0 pg   MCHC 32.6  30.0 - 36.0 g/dL  RDW 13.8  11.5 - 15.5 %   Platelets 273  150 - 400 K/uL  COMPREHENSIVE METABOLIC PANEL     Status: Abnormal   Collection Time    12/04/13 12:16 PM      Result Value Range   Sodium 140  137 - 147 mEq/L   Potassium 4.7  3.7 - 5.3 mEq/L   Chloride 102  96 - 112 mEq/L   CO2 24  19 - 32 mEq/L   Glucose, Bld 83  70 - 99 mg/dL   BUN 6  6 - 23 mg/dL   Creatinine, Ser 0.85  0.50 - 1.10 mg/dL   Calcium 9.4  8.4 - 10.5 mg/dL   Total Protein 6.6  6.0 - 8.3 g/dL   Albumin 2.6 (*) 3.5 - 5.2 g/dL   AST 13  0 - 37 U/L   ALT 9  0 - 35 U/L   Alkaline Phosphatase 168 (*) 39 - 117 U/L   Total Bilirubin 0.4  0.3 - 1.2 mg/dL   GFR calc non Af Amer >90  >90 mL/min   GFR calc Af Amer >90  >90 mL/min  URINALYSIS, ROUTINE W REFLEX MICROSCOPIC     Status: Abnormal   Collection Time    12/04/13 12:30 PM      Result Value Range   Color, Urine AMBER (*) YELLOW   APPearance HAZY (*) CLEAR   Specific Gravity, Urine >1.030 (*) 1.005 - 1.030   pH 6.0  5.0 -  8.0   Glucose, UA NEGATIVE  NEGATIVE mg/dL   Hgb urine dipstick SMALL (*) NEGATIVE   Bilirubin Urine NEGATIVE  NEGATIVE   Ketones, ur NEGATIVE  NEGATIVE mg/dL   Protein, ur NEGATIVE  NEGATIVE mg/dL   Urobilinogen, UA 1.0  0.0 - 1.0 mg/dL   Nitrite NEGATIVE  NEGATIVE   Leukocytes, UA SMALL (*) NEGATIVE  URINE MICROSCOPIC-ADD ON     Status: Abnormal   Collection Time    12/04/13 12:30 PM      Result Value Range   Squamous Epithelial / LPF MANY (*) RARE   WBC, UA 7-10  <3 WBC/hpf   RBC / HPF 0-2  <3 RBC/hpf   Urine-Other MUCOUS PRESENT     1348: D/W Dr. Marvel Plan. Will give 1-2 L bolus and zofran. PO challenge with food. If tolerating PO foods will try PO dilaudid as well. Dr. Marvel Plan will call pharmacy for consult on IV abx.  1540: Patient reports increasing back pain. Dilaudid has not helped her pain at this time. She is writhing about in the bed. Appears very uncomfortable.   US Renal  12/04/2013   CLINICAL DATA:  Left flank pain.  Pregnancy.  EXAM: RENAL/URINARY TRACT ULTRASOUND COMPLETE  COMPARISON:  US RENAL dated 11/30/2013  FINDINGS: Right Kidney:  Length: 11.4 cm. Echogenicity within normal limits. No mass or hydronephrosis visualized.  Left Kidney:  Length: 13 cm. Echogenicity within normal limits. No mass, minimal hydronephrosis on the left.  Bladder:  Appears normal for degree of bladder distention.  IMPRESSION: Minimal left hydronephrosis.  Exam otherwise unremarkable.   Electronically Signed   By: Marcello Moores  Register   On: 12/04/2013 17:03   1723: Dr. Marvel Plan updated. She will stop by to see the patient.  1740: Dr. Marvel Plan on the unit to see the patient.    Assessment and Plan  Admit to ante for OBS  [name] 12/04/2013, 1:01 PM   Came and d/w pt her status.  CBC shows improved WBC and  renal US with only mild left hydronephrosis seen.  Since she has been here, she has had another bout of severe left flank pain that did not resolve for about 45 minutes.   Got better eventually with po dilaudid, but she did have emesis x 1.  Pain is low grade currently.  She states this has been typical since Saturday, that she will have a painful exacerbation 4-5 x a day that takes about 45 minutes to resolve.  She has received a dose of gentamicin (extended dosing) in case her urine was resistant to rocephin, since we have no culture.  Culture sent now, but doubtful will grow since on antibiotics for several days.  No high temps. Just low grade at home.  We will admit her to observation to make sure she can begin to tolerate po again prior to d/c and maybe give one more dose of gentamicin tomorrow.  Repeat CBC in AM.  FHR reactive, occasional contractions. Zofran prn, will try to increase po intake.

## 2013-12-04 NOTE — MAU Note (Signed)
Patient states she has been having back pain on and off since 1-22. States some irregular contraction but the back pain is getting worse. Has had vomiting and has not been able to keep her antibiotic or pain medication down due to vomiting. Reports good fetal movement. States she had a little blood on tissue with wiping this am.

## 2013-12-05 LAB — TYPE AND SCREEN
ABO/RH(D): A POS
ANTIBODY SCREEN: NEGATIVE

## 2013-12-05 LAB — CBC
HCT: 29 % — ABNORMAL LOW (ref 36.0–46.0)
HEMOGLOBIN: 9.6 g/dL — AB (ref 12.0–15.0)
MCH: 25.9 pg — ABNORMAL LOW (ref 26.0–34.0)
MCHC: 33.1 g/dL (ref 30.0–36.0)
MCV: 78.2 fL (ref 78.0–100.0)
Platelets: 213 10*3/uL (ref 150–400)
RBC: 3.71 MIL/uL — ABNORMAL LOW (ref 3.87–5.11)
RDW: 13.8 % (ref 11.5–15.5)
WBC: 6.4 10*3/uL (ref 4.0–10.5)

## 2013-12-05 LAB — URINE CULTURE
COLONY COUNT: NO GROWTH
CULTURE: NO GROWTH

## 2013-12-05 MED ORDER — AMOXICILLIN-POT CLAVULANATE 875-125 MG PO TABS: 1.0000 | ORAL_TABLET | Freq: Two times a day (BID) | ORAL | Status: DC

## 2013-12-05 MED ORDER — HYDROMORPHONE HCL 2 MG PO TABS: 2.0000 mg | ORAL_TABLET | Freq: Four times a day (QID) | ORAL | Status: DC | PRN

## 2013-12-05 NOTE — Progress Notes (Signed)
Patient ID: Stephonie Wilcoxen, female   DOB: 1986/10/31, 28 y.o.   MRN: [id number]  36+ wk G1 - admitted for poss pyelo with left flank colicky pain  Pain helped by po dilaudid.  Gentamicin for abx.  States baseline pain much improved.    AFVSS gen NAD Abd soft, NT FHTs 150-160's, category 1 toco occ  SVE 2cm per RN  Flank pain ? CVAT - mild  After 2pm dose of abx will d/c with Augmentin for 10 days, also #15 dilaudid.  F/U as scheduled in office on Friday.   Pt voices understanding and agreement.

## 2013-12-05 NOTE — Discharge Instructions (Signed)
Pyelonephritis, Adult Pyelonephritis is a kidney infection. In general, there are 2 main types of pyelonephritis:  Infections that come on quickly without any warning (acute pyelonephritis).  Infections that persist for a long period of time (chronic pyelonephritis). CAUSES  Two main causes of pyelonephritis are:  Bacteria traveling from the bladder to the kidney. This is a problem especially in pregnant women. The urine in the bladder can become filled with bacteria from multiple causes, including:  Inflammation of the prostate gland (prostatitis).  Sexual intercourse in females.  Bladder infection (cystitis).  Bacteria traveling from the bloodstream to the tissue part of the kidney. Problems that may increase your risk of getting a kidney infection include:  Diabetes.  Kidney stones or bladder stones.  Cancer.  Catheters placed in the bladder.  Other abnormalities of the kidney or ureter. SYMPTOMS   Abdominal pain.  Pain in the side or flank area.  Fever.  Chills.  Upset stomach.  Blood in the urine (dark urine).  Frequent urination.  Strong or persistent urge to urinate.  Burning or stinging when urinating. DIAGNOSIS  Your caregiver may diagnose your kidney infection based on your symptoms. A urine sample may also be taken. TREATMENT  In general, treatment depends on how severe the infection is.   If the infection is mild and caught early, your caregiver may treat you with oral antibiotics and send you home.  If the infection is more severe, the bacteria may have gotten into the bloodstream. This will require intravenous (IV) antibiotics and a hospital stay. Symptoms may include:  High fever.  Severe flank pain.  Shaking chills.  Even after a hospital stay, your caregiver may require you to be on oral antibiotics for a period of time.  Other treatments may be required depending upon the cause of the infection. HOME CARE INSTRUCTIONS   Take your  antibiotics as directed. Finish them even if you start to feel better.  Make an appointment to have your urine checked to make sure the infection is gone.  Drink enough fluids to keep your urine clear or pale yellow.  Take medicines for the bladder if you have urgency and frequency of urination as directed by your caregiver. SEEK IMMEDIATE MEDICAL CARE IF:   You have a fever or persistent symptoms for more than 2-3 days.  You have a fever and your symptoms suddenly get worse.  You are unable to take your antibiotics or fluids.  You develop shaking chills.  You experience extreme weakness or fainting.  There is no improvement after 2 days of treatment. MAKE SURE YOU:  Understand these instructions.  Will watch your condition.  Will get help right away if you are not doing well or get worse. Document Released: 10/26/2005 Document Revised: 04/26/2012 Document Reviewed: 04/01/2011 [facility] Patient Information 2014 [place], [place].  Pregnancy and Urinary Tract Infection A urinary tract infection (UTI) is a bacterial infection of the urinary tract. Infection of the urinary tract can include the ureters, kidneys (pyelonephritis), bladder (cystitis), and urethra (urethritis). All pregnant women should be screened for bacteria in the urinary tract. Identifying and treating a UTI will decrease the risk of preterm labor and developing more serious infections in both the mother and baby. CAUSES Bacteria germs cause almost all UTIs.  RISK FACTORS Many factors can increase your chances of getting a UTI during pregnancy. These include:  Having a short urethra.  Poor toilet and hygiene habits.  Sexual intercourse.  Blockage of urine along the urinary tract.  Problems with the pelvic muscles or nerves.  Diabetes.  Obesity.  Bladder problems after having several children.  Previous history of UTI. SIGNS AND SYMPTOMS   Pain, burning, or a stinging feeling when  urinating.  Suddenly feeling the need to urinate right away (urgency).  Loss of bladder control (urinary incontinence).  Frequent urination, more than is common with pregnancy.  Lower abdominal or back discomfort.  Cloudy urine.  Blood in the urine (hematuria).  Fever. When the kidneys are infected, the symptoms may be:  Back pain.  Flank pain on the right side more so than the left.  Fever.  Chills.  Nausea.  Vomiting. DIAGNOSIS  A urinary tract infection is usually diagnosed through urine tests. Additional tests and procedures are sometimes done. These may include:  Ultrasound exam of the kidneys, ureters, bladder, and urethra.  Looking in the bladder with a lighted tube (cystoscopy). TREATMENT Typically, UTIs can be treated with antibiotic medicines.  HOME CARE INSTRUCTIONS   Only take over-the-counter or prescription medicines as directed by your health care provider. If you were prescribed antibiotics, take them as directed. Finish them even if you start to feel better.  Drink enough fluids to keep your urine clear or pale yellow.  Do not have sexual intercourse until the infection is gone and your health care provider says it is okay.  Make sure you are tested for UTIs throughout your pregnancy. These infections often come back. Preventing a UTI in the Future  Practice good toilet habits. Always wipe from front to back. Use the tissue only once.  Do not hold your urine. Empty your bladder as soon as possible when the urge comes.  Do not douche or use deodorant sprays.  Wash with soap and warm water around the genital area and the anus.  Empty your bladder before and after sexual intercourse.  Wear underwear with a cotton crotch.  Avoid caffeine and carbonated drinks. They can irritate the bladder.  Drink cranberry juice or take cranberry pills. This may decrease the risk of getting a UTI.  Do not drink alcohol.  Keep all your appointments and  tests as scheduled. SEEK MEDICAL CARE IF:   Your symptoms get worse.  You are still having fevers 2 or more days after treatment begins.  You have a rash.  You feel that you are having problems with medicines prescribed.  You have abnormal vaginal discharge. SEEK IMMEDIATE MEDICAL CARE IF:   You have back or flank pain.  You have chills.  You have blood in your urine.  You have nausea and vomiting.  You have contractions of your uterus.  You have a gush of fluid from the vagina. MAKE SURE YOU:  Understand these instructions.   Will watch your condition.   Will get help right away if you are not doing well or get worse.  Document Released: 02/20/2011 Document Revised: 08/16/2013 Document Reviewed: 05/25/2013 [facility] Patient Information 2014 [place], [place].

## 2013-12-05 NOTE — Discharge Summary (Signed)
Obstetric Discharge Summary Reason for Admission: IUP @ 89+, pyelo, colicky flank pain Prenatal Procedures: ultrasound - renal, NST Intrapartum Procedures: N/A Postpartum Procedures: N/A Complications-Operative and Postpartum: N/A Hemoglobin  Date Value Range Status  12/05/2013 9.6* 12.0 - 15.0 g/dL Final     HCT  Date Value Range Status  12/05/2013 29.0* 36.0 - 46.0 % Final    Physical Exam:  General: alert and no distress Abd soft, NT CVAT - mild discomfort, not CVAT  Discharge Diagnoses: IUP @ 36+, pyelo s/p IV abx  Discharge Information: Date: 12/05/2013 Activity: unrestricted Diet: routine Medications: PNV and Augmentin Condition: improved Instructions: refer to practice specific booklet Discharge to: home Follow-up Information   Follow up with BOVARD,Madalina Rosman, MD In 4 days. (Prenatal Appointment as scheduled)    Specialty:  Obstetrics and Gynecology   Contact information:   [address] [place] [address] [phone]       BOVARD,Shalene Gallen 12/05/2013, 7:50 AM

## 2013-12-05 NOTE — Progress Notes (Signed)
Patient understands her discharge instructions.  She was given information about UTI's and Pylonephritis.  She understands signs of labor.

## 2013-12-15 ENCOUNTER — Inpatient Hospital Stay (HOSPITAL_COMMUNITY)
Admission: AD | Admit: 2013-12-15 | Discharge: 2013-12-18 | DRG: 775 | Disposition: A | Discharge status: Home / Self Care | Payer: BC Managed Care – PPO | Source: Ambulatory Visit | Attending: Obstetrics and Gynecology | Admitting: Obstetrics and Gynecology

## 2013-12-15 ENCOUNTER — Encounter (HOSPITAL_COMMUNITY): Payer: Self-pay | Admitting: *Deleted

## 2013-12-15 DIAGNOSIS — Z22330 Carrier of Group B streptococcus: Secondary | ICD-10-CM

## 2013-12-15 DIAGNOSIS — O4290 Premature rupture of membranes, unspecified as to length of time between rupture and onset of labor, unspecified weeks of gestation: Principal | ICD-10-CM | POA: Diagnosis present

## 2013-12-15 DIAGNOSIS — IMO0001 Reserved for inherently not codable concepts without codable children: Secondary | ICD-10-CM

## 2013-12-15 DIAGNOSIS — N1330 Unspecified hydronephrosis: Secondary | ICD-10-CM | POA: Diagnosis present

## 2013-12-15 DIAGNOSIS — O2303 Infections of kidney in pregnancy, third trimester: Secondary | ICD-10-CM

## 2013-12-15 DIAGNOSIS — O9989 Other specified diseases and conditions complicating pregnancy, childbirth and the puerperium: Secondary | ICD-10-CM

## 2013-12-15 DIAGNOSIS — O99892 Other specified diseases and conditions complicating childbirth: Secondary | ICD-10-CM | POA: Diagnosis present

## 2013-12-15 DIAGNOSIS — O26839 Pregnancy related renal disease, unspecified trimester: Secondary | ICD-10-CM | POA: Diagnosis present

## 2013-12-15 LAB — TYPE AND SCREEN
ABO/RH(D): A POS
ANTIBODY SCREEN: NEGATIVE

## 2013-12-15 LAB — CBC
HCT: 33.6 % — ABNORMAL LOW (ref 36.0–46.0)
HEMOGLOBIN: 11 g/dL — AB (ref 12.0–15.0)
MCH: 25.5 pg — AB (ref 26.0–34.0)
MCHC: 32.7 g/dL (ref 30.0–36.0)
MCV: 77.8 fL — ABNORMAL LOW (ref 78.0–100.0)
PLATELETS: 312 10*3/uL (ref 150–400)
RBC: 4.32 MIL/uL (ref 3.87–5.11)
RDW: 14.3 % (ref 11.5–15.5)
WBC: 9.3 10*3/uL (ref 4.0–10.5)

## 2013-12-15 LAB — AMNISURE RUPTURE OF MEMBRANE (ROM) NOT AT ARMC: Amnisure ROM: POSITIVE

## 2013-12-15 MED ORDER — LACTATED RINGERS IV SOLN: 500.0000 mL | INTRAVENOUS | Status: DC | PRN

## 2013-12-15 MED ORDER — PENICILLIN G POTASSIUM 5000000 UNITS IJ SOLR
2.5000 10*6.[IU] | INTRAVENOUS | Status: DC
  Administered 2013-12-15 – 2013-12-16 (×3): 2.5 10*6.[IU] via INTRAVENOUS
  Filled 2013-12-15 (×6): qty 2.5

## 2013-12-15 MED ORDER — FLEET ENEMA 7-19 GM/118ML RE ENEM: 1.0000 | ENEMA | RECTAL | Status: DC | PRN

## 2013-12-15 MED ORDER — CITRIC ACID-SODIUM CITRATE 334-500 MG/5ML PO SOLN: 30.0000 mL | ORAL | Status: DC | PRN

## 2013-12-15 MED ORDER — OXYTOCIN BOLUS FROM INFUSION: 500.0000 mL | INTRAVENOUS | Status: DC

## 2013-12-15 MED ORDER — OXYTOCIN 40 UNITS IN LACTATED RINGERS INFUSION - SIMPLE MED
1.0000 m[IU]/min | INTRAVENOUS | Status: DC
  Administered 2013-12-15: 1 m[IU]/min via INTRAVENOUS
  Administered 2013-12-16: 31 m[IU]/min via INTRAVENOUS
  Administered 2013-12-16: 29 m[IU]/min via INTRAVENOUS
  Filled 2013-12-15: qty 1000

## 2013-12-15 MED ORDER — LACTATED RINGERS IV SOLN
INTRAVENOUS | Status: DC
  Administered 2013-12-15 – 2013-12-16 (×2): via INTRAVENOUS

## 2013-12-15 MED ORDER — LIDOCAINE HCL (PF) 1 % IJ SOLN
30.0000 mL | INTRAMUSCULAR | Status: DC | PRN
  Filled 2013-12-15: qty 30

## 2013-12-15 MED ORDER — PENICILLIN G POTASSIUM 5000000 UNITS IJ SOLR
5.0000 10*6.[IU] | Freq: Once | INTRAVENOUS | Status: AC
  Administered 2013-12-15: 5 10*6.[IU] via INTRAVENOUS
  Filled 2013-12-15: qty 5

## 2013-12-15 MED ORDER — OXYTOCIN 40 UNITS IN LACTATED RINGERS INFUSION - SIMPLE MED: 62.5000 mL/h | INTRAVENOUS | Status: DC

## 2013-12-15 MED ORDER — OXYCODONE-ACETAMINOPHEN 5-325 MG PO TABS: 1.0000 | ORAL_TABLET | ORAL | Status: DC | PRN

## 2013-12-15 MED ORDER — IBUPROFEN 600 MG PO TABS: 600.0000 mg | ORAL_TABLET | Freq: Four times a day (QID) | ORAL | Status: DC | PRN

## 2013-12-15 MED ORDER — TERBUTALINE SULFATE 1 MG/ML IJ SOLN: 0.2500 mg | Freq: Once | INTRAMUSCULAR | Status: AC | PRN

## 2013-12-15 MED ORDER — ONDANSETRON HCL 4 MG/2ML IJ SOLN: 4.0000 mg | Freq: Four times a day (QID) | INTRAMUSCULAR | Status: DC | PRN

## 2013-12-15 MED ORDER — ACETAMINOPHEN 325 MG PO TABS: 650.0000 mg | ORAL_TABLET | ORAL | Status: DC | PRN

## 2013-12-15 NOTE — H&P (Signed)
Emily Rhodes, Emily Rhodes               ACCOUNT NO.:  [id number]  MEDICAL RECORD NO.:  [id number]  LOCATION:  9166                          FACILITY:  [facility]  PHYSICIAN:  Lucille Passy. Ulanda Edison, M.D. DATE OF BIRTH:  08/24/1986  DATE OF ADMISSION:  12/15/2013 DATE OF DISCHARGE:                             HISTORY & PHYSICAL   PRESENT ILLNESS:  This is a 28 year old white female, para 0, gravida 1, at 38 weeks and 2 days, who is admitted with premature rupture of the membranes.  The patient began her prenatal course at 7 weeks and 6 days after an ultrasound at 7 weeks and 5 days.  She is admitted after being thinking that she had begun to leak fluid last night at about 11 p.m. She came to the office today.  Dr. Melba Coon examined her, was unable to determine whether it was ruptured membrane.  She centered to the maternity admission unit for an AmniSure and the AmniSure was positive. She was admitted.  She has a positive group B strep.  She is started on Pitocin and penicillin.  Blood group and type A positive, negative antibody.  Pap smear normal.  Rubella immune.  RPR nonreactive.  Urine culture negative.  Hepatitis B surface antigen negative, HIV negative, GC and Chlamydia negative.  One hour Glucola was 147, 3-hour GTT 77, 135, 99 and 128.  Repeat HIV and RPR were negative.  GC and Chlamydia were negative.  Group B strep positive.  The patient initially thought she wanted first trimester screen, but she later canceled the appointment.  She declined the AFP screening at 35 weeks and 2 days. Her blood pressure 142/88.  Next, blood pressure is 122/78.  She was evaluated for painful back and cramping, contractions or pain every 4 minutes.  Vaginal exam on November 30, 2013, the cervix is 1.5 cm.  She was having a localized left low back pain.  She was admitted to the hospital.  White count went from 14,000 down to 11,000.  She was given IV Dilaudid for recurrent severe flank pain, minimal hydronephrosis  on the left noted on ultrasound.  Pain subsequently diminished.  Last night she had what she thought was leaking fluid.  She came to the office today.  It was uncertain whether it was ruptured membranes.  AmniSure at MAU was positive.  PAST MEDICAL HISTORY:  She had been thought to possibly have a mass, but this was negative workup.  She does have optic neuritis in the right eye with straining or stress.  She was told that she could push during labor.  PAST SURGICAL HISTORY:  No surgeries were noted.  ALLERGIES:  No known drug allergies and no latex allergy.  SOCIAL HISTORY:  She has never smoked.  She does not drink.  No known drug use, 2 years of college.  Works as a Chemical engineer.  FAMILY HISTORY:  Father with high blood pressure and diabetes.  PHYSICAL EXAMINATION:  VITAL SIGNS:  On admission, temperature 97.5, pulse 99, respirations 20, blood pressure 142/75. HEART:  Normal size and sounds.  No murmurs. LUNGS:  Clear to auscultation.  Fundal height at her last prenatal visit prior to today on December 08, 2013, was 37 cm.  Fetal heart tones are normal.  Cervix is 2 cm, about 30-40% effaced, vertex, at a -2. AmniSure confirmed ruptured membranes.  ADMITTING IMPRESSION:  Intrauterine pregnancy 38 weeks and 2 days, premature rupture of the membranes, positive group B strep.  The patient is admitted for treatment of group B strep, and induction of labor.     Lucille Passy. Ulanda Edison, M.D.     TFH/MEDQ  D:  12/15/2013  T:  12/15/2013  Job:  [id number]

## 2013-12-15 NOTE — MAU Note (Signed)
?   Leaking, having little squirts of clear liquid, started yesterday. Small amt of bleeding after exam.  No pain.

## 2013-12-15 NOTE — Progress Notes (Signed)
Patient ID: Emily Rhodes, female   DOB: 1986-03-30, 28 y.o.   MRN: [id number] Contractions are q 2-4 minutes but not painful. We need to increase the pitocin. The cervix is 2 cm 50 % effaced and the vertex is at - 2 station. The FHR looks normal

## 2013-12-16 ENCOUNTER — Encounter (HOSPITAL_COMMUNITY): Payer: BC Managed Care – PPO | Admitting: Anesthesiology

## 2013-12-16 ENCOUNTER — Encounter (HOSPITAL_COMMUNITY): Payer: Self-pay | Admitting: *Deleted

## 2013-12-16 ENCOUNTER — Inpatient Hospital Stay (HOSPITAL_COMMUNITY): Payer: BC Managed Care – PPO | Admitting: Anesthesiology

## 2013-12-16 LAB — RPR: RPR Ser Ql: NONREACTIVE

## 2013-12-16 MED ORDER — HYDROMORPHONE HCL 2 MG PO TABS: 2.0000 mg | ORAL_TABLET | ORAL | Status: DC | PRN

## 2013-12-16 MED ORDER — PHENYLEPHRINE 40 MCG/ML (10ML) SYRINGE FOR IV PUSH (FOR BLOOD PRESSURE SUPPORT): 80.0000 ug | PREFILLED_SYRINGE | INTRAVENOUS | Status: DC | PRN

## 2013-12-16 MED ORDER — FENTANYL CITRATE 0.05 MG/ML IJ SOLN
INTRAMUSCULAR | Status: DC | PRN
  Administered 2013-12-16: 100 ug via EPIDURAL

## 2013-12-16 MED ORDER — ZOLPIDEM TARTRATE 5 MG PO TABS: 5.0000 mg | ORAL_TABLET | Freq: Every evening | ORAL | Status: DC | PRN

## 2013-12-16 MED ORDER — FERROUS SULFATE 325 (65 FE) MG PO TABS
325.0000 mg | ORAL_TABLET | Freq: Two times a day (BID) | ORAL | Status: DC
  Administered 2013-12-17 – 2013-12-18 (×3): 325 mg via ORAL
  Filled 2013-12-16 (×3): qty 1

## 2013-12-16 MED ORDER — OXYTOCIN 40 UNITS IN LACTATED RINGERS INFUSION - SIMPLE MED: 62.5000 mL/h | INTRAVENOUS | Status: AC | PRN

## 2013-12-16 MED ORDER — LIDOCAINE HCL (PF) 1 % IJ SOLN
INTRAMUSCULAR | Status: DC | PRN
  Administered 2013-12-16 (×3): 5 mL

## 2013-12-16 MED ORDER — FENTANYL CITRATE 0.05 MG/ML IJ SOLN
INTRAMUSCULAR | Status: AC
  Filled 2013-12-16: qty 2

## 2013-12-16 MED ORDER — WITCH HAZEL-GLYCERIN EX PADS: 1.0000 "application " | MEDICATED_PAD | CUTANEOUS | Status: DC | PRN

## 2013-12-16 MED ORDER — FAMOTIDINE 20 MG PO TABS
20.0000 mg | ORAL_TABLET | Freq: Two times a day (BID) | ORAL | Status: DC
  Administered 2013-12-16 – 2013-12-18 (×5): 20 mg via ORAL
  Filled 2013-12-16 (×6): qty 1

## 2013-12-16 MED ORDER — DIPHENHYDRAMINE HCL 50 MG/ML IJ SOLN: 12.5000 mg | INTRAMUSCULAR | Status: DC | PRN

## 2013-12-16 MED ORDER — LANOLIN HYDROUS EX OINT: TOPICAL_OINTMENT | CUTANEOUS | Status: DC | PRN

## 2013-12-16 MED ORDER — FENTANYL 2.5 MCG/ML BUPIVACAINE 1/10 % EPIDURAL INFUSION (WH - ANES)
INTRAMUSCULAR | Status: DC | PRN
  Administered 2013-12-16: 14 mL/h via EPIDURAL

## 2013-12-16 MED ORDER — ONDANSETRON HCL 4 MG PO TABS: 4.0000 mg | ORAL_TABLET | ORAL | Status: DC | PRN

## 2013-12-16 MED ORDER — PRENATAL MULTIVITAMIN CH
1.0000 | ORAL_TABLET | Freq: Every day | ORAL | Status: DC
  Administered 2013-12-16 – 2013-12-18 (×3): 1 via ORAL
  Filled 2013-12-16 (×3): qty 1

## 2013-12-16 MED ORDER — BUPIVACAINE HCL (PF) 0.25 % IJ SOLN
INTRAMUSCULAR | Status: DC | PRN
  Administered 2013-12-16: 8 mL via EPIDURAL

## 2013-12-16 MED ORDER — LACTATED RINGERS IV SOLN: INTRAVENOUS | Status: AC

## 2013-12-16 MED ORDER — SIMETHICONE 80 MG PO CHEW: 80.0000 mg | CHEWABLE_TABLET | ORAL | Status: DC | PRN

## 2013-12-16 MED ORDER — DIPHENHYDRAMINE HCL 25 MG PO CAPS: 25.0000 mg | ORAL_CAPSULE | Freq: Four times a day (QID) | ORAL | Status: DC | PRN

## 2013-12-16 MED ORDER — SENNOSIDES-DOCUSATE SODIUM 8.6-50 MG PO TABS
2.0000 | ORAL_TABLET | ORAL | Status: DC
  Administered 2013-12-17 (×2): 2 via ORAL
  Filled 2013-12-16 (×2): qty 2

## 2013-12-16 MED ORDER — FENTANYL 2.5 MCG/ML BUPIVACAINE 1/10 % EPIDURAL INFUSION (WH - ANES)
14.0000 mL/h | INTRAMUSCULAR | Status: DC | PRN
  Filled 2013-12-16: qty 125

## 2013-12-16 MED ORDER — IBUPROFEN 600 MG PO TABS
600.0000 mg | ORAL_TABLET | Freq: Four times a day (QID) | ORAL | Status: DC
  Administered 2013-12-16 – 2013-12-18 (×9): 600 mg via ORAL
  Filled 2013-12-16 (×9): qty 1

## 2013-12-16 MED ORDER — PANTOPRAZOLE SODIUM 40 MG PO TBEC
40.0000 mg | DELAYED_RELEASE_TABLET | Freq: Every day | ORAL | Status: DC
  Administered 2013-12-16 – 2013-12-17 (×2): 40 mg via ORAL
  Filled 2013-12-16 (×3): qty 1

## 2013-12-16 MED ORDER — ONDANSETRON HCL 4 MG/2ML IJ SOLN: 4.0000 mg | INTRAMUSCULAR | Status: DC | PRN

## 2013-12-16 MED ORDER — DIBUCAINE 1 % RE OINT: 1.0000 "application " | TOPICAL_OINTMENT | RECTAL | Status: DC | PRN

## 2013-12-16 MED ORDER — MEASLES, MUMPS & RUBELLA VAC ~~LOC~~ INJ
0.5000 mL | INJECTION | Freq: Once | SUBCUTANEOUS | Status: DC
  Filled 2013-12-16: qty 0.5

## 2013-12-16 MED ORDER — BENZOCAINE-MENTHOL 20-0.5 % EX AERO
1.0000 "application " | INHALATION_SPRAY | CUTANEOUS | Status: DC | PRN
  Administered 2013-12-16: 1 via TOPICAL
  Filled 2013-12-16: qty 56

## 2013-12-16 MED ORDER — TETANUS-DIPHTH-ACELL PERTUSSIS 5-2.5-18.5 LF-MCG/0.5 IM SUSP: 0.5000 mL | Freq: Once | INTRAMUSCULAR | Status: DC

## 2013-12-16 MED ORDER — EPHEDRINE 5 MG/ML INJ: 10.0000 mg | INTRAVENOUS | Status: DC | PRN

## 2013-12-16 MED ORDER — PHENYLEPHRINE 40 MCG/ML (10ML) SYRINGE FOR IV PUSH (FOR BLOOD PRESSURE SUPPORT)
PREFILLED_SYRINGE | INTRAVENOUS | Status: AC
  Filled 2013-12-16: qty 5

## 2013-12-16 MED ORDER — EPHEDRINE 5 MG/ML INJ
10.0000 mg | INTRAVENOUS | Status: DC | PRN
  Filled 2013-12-16: qty 4

## 2013-12-16 MED ORDER — PHENYLEPHRINE 40 MCG/ML (10ML) SYRINGE FOR IV PUSH (FOR BLOOD PRESSURE SUPPORT)
80.0000 ug | PREFILLED_SYRINGE | INTRAVENOUS | Status: DC | PRN
  Filled 2013-12-16: qty 10

## 2013-12-16 MED ORDER — LACTATED RINGERS IV SOLN
500.0000 mL | Freq: Once | INTRAVENOUS | Status: AC
  Administered 2013-12-16: 500 mL via INTRAVENOUS

## 2013-12-16 NOTE — Anesthesia Postprocedure Evaluation (Signed)
Anesthesia Post Note  Patient: Emily Rhodes  Procedure(s) Performed: * No procedures listed *  Anesthesia type: Epidural  Patient location: Mother/Baby  Post pain: Pain level controlled  Post assessment: Post-op Vital signs reviewed  Last Vitals:  Filed Vitals:   12/16/13 1210  BP: 123/80  Pulse: 111  Temp: 36.8 C  Resp: 18    Post vital signs: Reviewed  Level of consciousness:alert  Complications: No apparent anesthesia complications

## 2013-12-16 NOTE — Lactation Note (Signed)
This note was copied from the chart of Emily Shakora Nordquist. Lactation Consultation Note  Patient Name: Emily Rhodes [id number] Date: 12/16/2013 Reason for consult: Initial assessment BF basics reviewed with parents. Encouraged to BF with feeding ques, at least every 3 hours. Mom has semi-flat nipples. Mom reports baby has latched twice. Nipples do become more erect with hand expression. Demonstrated hand pump to use to pre-pump if needed. Lactation brochure left for review. Advised of OP services and support group. Encouraged Mom to call as needed for assist.   Maternal Data Formula Feeding for Exclusion: No Infant to breast within first hour of birth: Yes Has patient been taught Hand Expression?: Yes Does the patient have breastfeeding experience prior to this delivery?: No  Feeding Feeding Type: Breast Fed Length of feed: 7 min  LATCH Score/Interventions                      Lactation Tools Discussed/Used WIC Program: No   Consult Status Consult Status: Follow-up Date: 12/17/13 Follow-up type: In-patient    Katrine Coho 12/16/2013, 8:22 PM

## 2013-12-16 NOTE — Anesthesia Preprocedure Evaluation (Signed)

## 2013-12-16 NOTE — Anesthesia Procedure Notes (Signed)
Epidural Patient location during procedure: OB  Staffing Anesthesiologist: Montez Hageman Performed by: anesthesiologist   Preanesthetic Checklist Completed: patient identified, site marked, surgical consent, pre-op evaluation, timeout performed, IV checked, risks and benefits discussed and monitors and equipment checked  Epidural Patient position: sitting Prep: ChloraPrep Patient monitoring: heart rate, continuous pulse ox and blood pressure Approach: right paramedian Injection technique: LOR saline  Needle:  Needle type: Tuohy  Needle gauge: 17 G Needle length: 9 cm and 9 Needle insertion depth: 7 cm Catheter type: closed end flexible Catheter size: 20 Guage Catheter at skin depth: 12 cm Test dose: negative  Assessment Events: blood not aspirated, injection not painful, no injection resistance, negative IV test and no paresthesia  Additional Notes   Patient tolerated the insertion well without complications.

## 2013-12-16 NOTE — Progress Notes (Signed)
Patient ID: Emily Rhodes, female   DOB: [birth year], 27 y.o.   MRN: [id number] Delivery note:  There pt made slow progress initially , but after raising the pitocin to 33 mu/ minute she began to make progress and reached full dilatation. She pushed well and delivered spontaneously ROA over a second degree ML laceration a living female infant Apgars 9 and 9 at 1 and 5 minutes. The placenta was intact and the uterus was normal. The laceration was repaired with 3-0 vicryl under epidural and local block. EBL 400 cc's.

## 2013-12-17 LAB — CBC
HEMATOCRIT: 29.6 % — AB (ref 36.0–46.0)
Hemoglobin: 9.7 g/dL — ABNORMAL LOW (ref 12.0–15.0)
MCH: 26.1 pg (ref 26.0–34.0)
MCHC: 32.8 g/dL (ref 30.0–36.0)
MCV: 79.8 fL (ref 78.0–100.0)
Platelets: 244 10*3/uL (ref 150–400)
RBC: 3.71 MIL/uL — ABNORMAL LOW (ref 3.87–5.11)
RDW: 15.4 % (ref 11.5–15.5)
WBC: 11.1 10*3/uL — AB (ref 4.0–10.5)

## 2013-12-17 NOTE — Progress Notes (Signed)
Patient ID: Emily Rhodes, female   DOB: 07-27-1986, 28 y.o.   MRN: [id number] #1 afebrile BP normal no complaints HGB stable

## 2013-12-17 NOTE — Lactation Note (Signed)
This note was copied from the chart of Emily Rhodes. Lactation Consultation Note  Patient Name: Emily Rhodes SKAJG'O Date: 12/17/2013 Reason for consult: Follow-up assessment;Difficult latch Called by RN to see Mom, Randel Books has not been able to sustain a latch today. Mom's nipples were noted yesterday to be flat, she has been wearing shells which are helping but baby is not able to sustain the latch with breast compression or position changes. After initiating #20 nipple shield baby latched easily, demonstrated a good suckling pattern and sustained the latch. Colostrum present after nursing, both breasts. Encouraged Mom to do some post pumping to encourage milk production. Mom will use hand pump while here, she has DEBP at home. Advised to make OP follow up before d/c. Cluster feeding reviewed with Mom. Advised to call for assist as needed.   Maternal Data    Feeding Feeding Type: Breast Fed Length of feed: 30 min  LATCH Score/Interventions Latch: Grasps breast easily, tongue down, lips flanged, rhythmical sucking. (uisng #20 nipple shield) Intervention(s): Adjust position;Assist with latch;Breast massage;Breast compression  Audible Swallowing: A few with stimulation Intervention(s): Hand expression  Type of Nipple: Everted at rest and after stimulation (short nipple shaft) Intervention(s): Shells;Hand pump  Comfort (Breast/Nipple): Soft / non-tender     Hold (Positioning): Assistance needed to correctly position infant at breast and maintain latch. Intervention(s): Breastfeeding basics reviewed;Support Pillows;Position options;Skin to skin  LATCH Score: 8  Lactation Tools Discussed/Used Tools: Shells;Nipple [place] Fuel;Pump Nipple shield size: 20;24 Shell Type: Inverted Breast pump type: Manual   Consult Status Consult Status: Follow-up Date: 12/18/13 Follow-up type: In-patient    Katrine Coho 12/17/2013, 5:39 PM

## 2013-12-18 MED ORDER — IBUPROFEN 600 MG PO TABS: 600.0000 mg | ORAL_TABLET | Freq: Four times a day (QID) | ORAL | Status: DC | PRN

## 2013-12-18 MED ORDER — HYDROMORPHONE HCL 2 MG PO TABS: 2.0000 mg | ORAL_TABLET | ORAL | Status: DC | PRN

## 2013-12-18 NOTE — Discharge Instructions (Signed)
booklet °

## 2013-12-18 NOTE — Progress Notes (Signed)
Patient ID: Emily Rhodes, female   DOB: 08-14-1986, 28 y.o.   MRN: [id number] #2 afebrile BP normal for d/c

## 2013-12-19 NOTE — Discharge Summary (Signed)
Emily Rhodes, Emily Rhodes               ACCOUNT NO.:  [id number]  MEDICAL RECORD NO.:  [id number]  LOCATION:  9140                          FACILITY:  [facility]  PHYSICIAN:  Lucille Passy. Ulanda Edison, M.D. DATE OF BIRTH:  1986/05/22  DATE OF ADMISSION:  12/15/2013 DATE OF DISCHARGE:  12/18/2013                              DISCHARGE SUMMARY   HISTORY:  This is a 28 year old white female, para 0, gravida 1, at 38 weeks and 2 days admitted with premature rupture of the membranes and the patient began her prenatal course at 7 weeks and 5 days.  She thought she began leaking fluid at 11:00 p.m. on December 14, 2013.  She came to the office with rupture of membranes, was not confirmed.  She was sent to maternity admission unit where an AmniSure was positive. She had a positive group B strep.  She was admitted to the hospital placed on Pitocin and penicillin.  At 10:22 p.m., the contractions were every 2-4 minutes, but were painful.  The cervix was 2 cm, 50%, vertex at a -2 station.  The patient later began to have painful contractions. She received an epidural.  She made slow progress, but after raising the Pitocin at 33 milliunits a minute she began to make progress and reached full dilatation.  She pushed well and delivered spontaneously, ROA over a second-degree midline laceration, a living female infant; Apgars of 9 and 9 at 1 and 5 minutes.  Placenta was intact.  Uterus normal. Laceration repaired with 3-0 Vicryl and epidural and local block.  Blood loss about 400 mL.  Postpartum, the patient did well and was discharged on the second postpartum day.  LABORATORY DATA:  Showed initial hemoglobin of 11, hematocrit 33.6, white count 9300, platelet count 312,000.  Followup hemoglobin 9.7.  RPR was nonreactive.  FINAL DIAGNOSES:  Intrauterine pregnancy at 38+ weeks, delivered ROA, premature rupture of the membranes positive group B strep.  OPERATION:  Spontaneous delivery ROA repair of second-degree  midline laceration.  FINAL CONDITION:  Improved.  INSTRUCTIONS:  Our regular discharge instruction booklet as well as the after visit summary.  Prescriptions for Dilaudid 2 mg, #20 tablets, 1 every 6 hours as needed for pain and Motrin 600 mg 30 tablets, 1 every 6 hours as needed for pain.  The patient is to continue her iron sulfate that she was on prior to delivery.  She is to return in 6 weeks for followup examination.     Lucille Passy. Ulanda Edison, M.D.     TFH/MEDQ  D:  12/18/2013  T:  12/19/2013  Job:  [id number]

## 2013-12-22 ENCOUNTER — Inpatient Hospital Stay (HOSPITAL_COMMUNITY): Admission: RE | Admit: 2013-12-22 | Payer: BC Managed Care – PPO | Source: Ambulatory Visit

## 2014-05-01 ENCOUNTER — Ambulatory Visit: Payer: Self-pay | Admitting: Family Medicine

## 2014-05-07 ENCOUNTER — Ambulatory Visit: Payer: Self-pay | Admitting: Family Medicine

## 2014-09-10 ENCOUNTER — Encounter (HOSPITAL_COMMUNITY): Payer: Self-pay | Admitting: *Deleted

## 2015-03-12 IMAGING — US US RENAL
1 series · 14 of 25 positions shown · non-contrast
Comparison: US RENAL dated 11/30/2013

CLINICAL DATA: Left flank pain.  Pregnancy.

EXAM:
RENAL/URINARY TRACT ULTRASOUND COMPLETE

[Series 1: us renal · 14 of 37 slices shown]
[im 1/37]
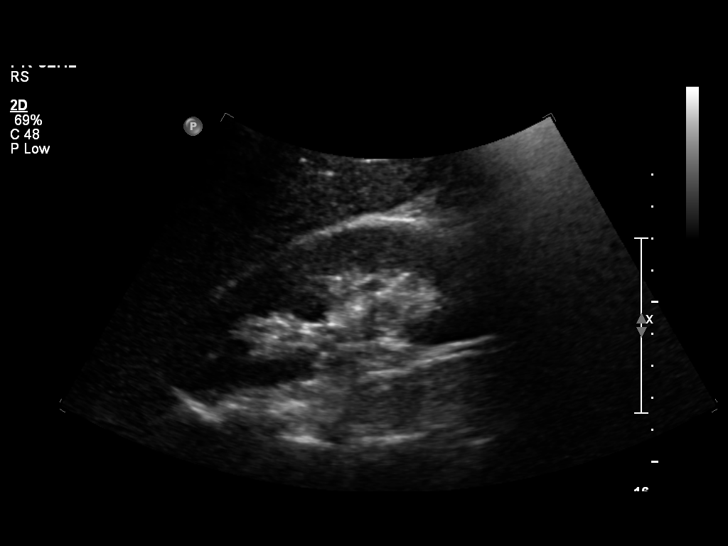
[im 4/37]
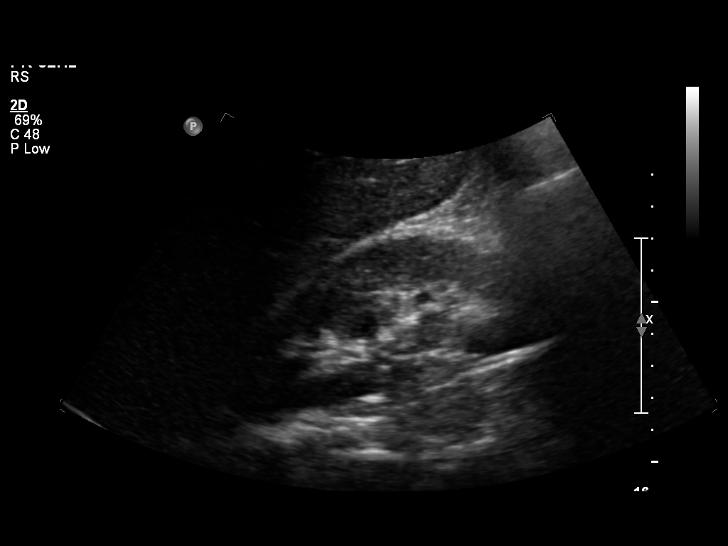
[im 7/37]
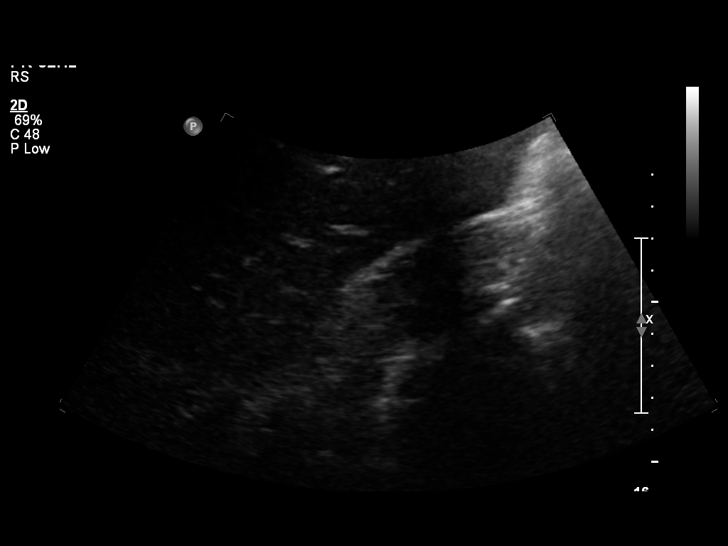
[im 10/37]
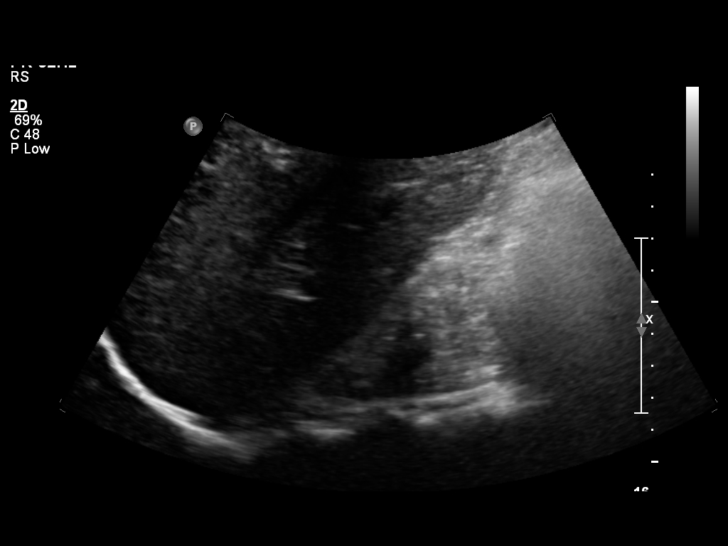
[im 13/37]
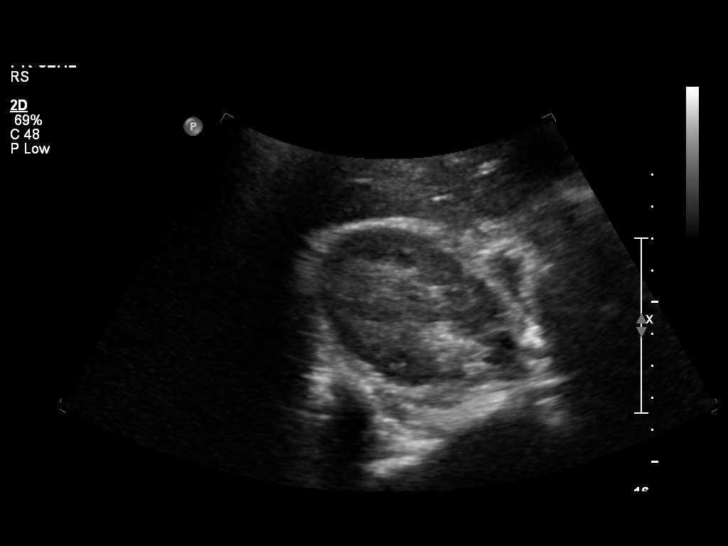
[im 14/37]
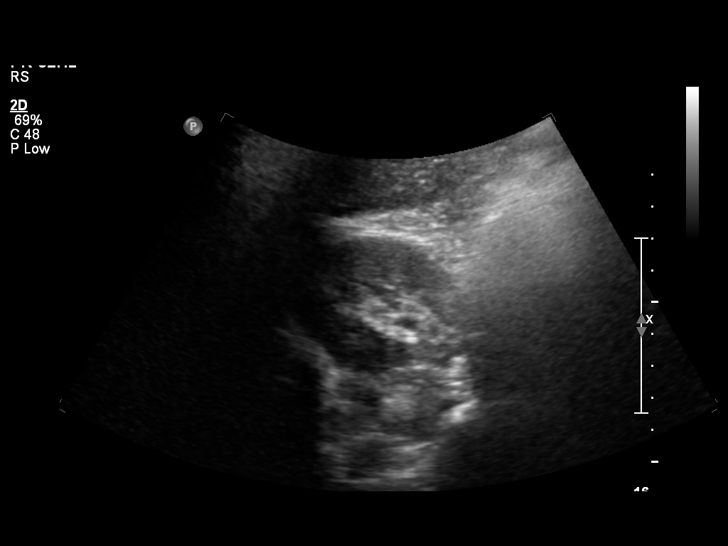
[im 17/37]
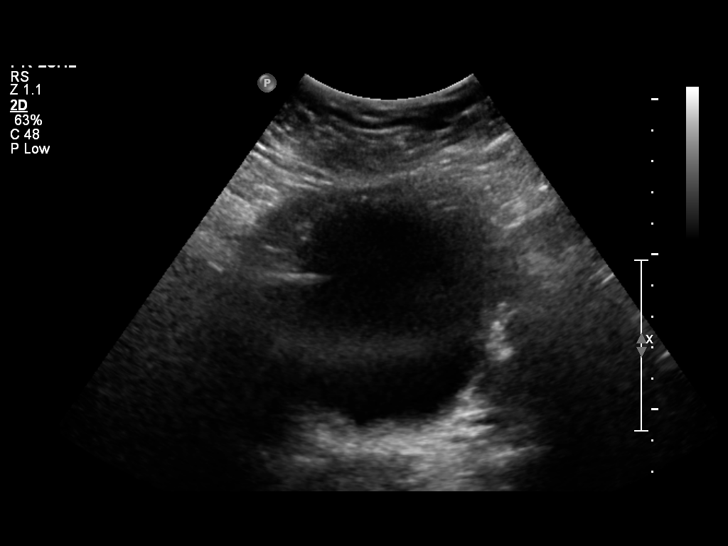
[im 20/37]
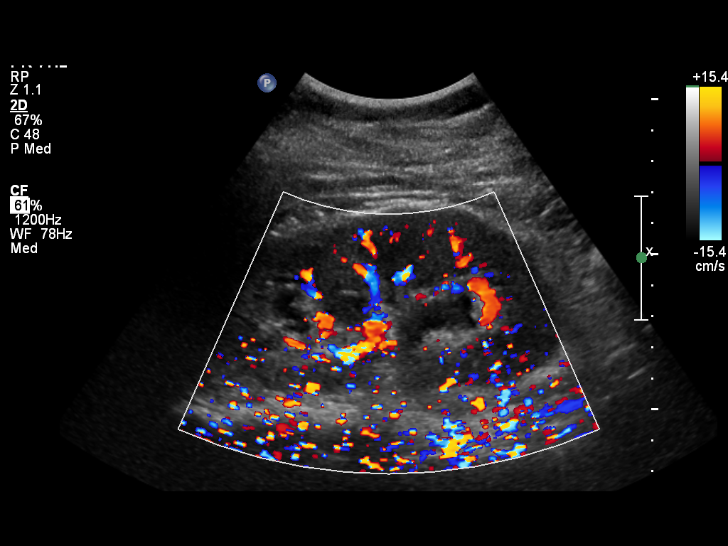
[im 23/37]
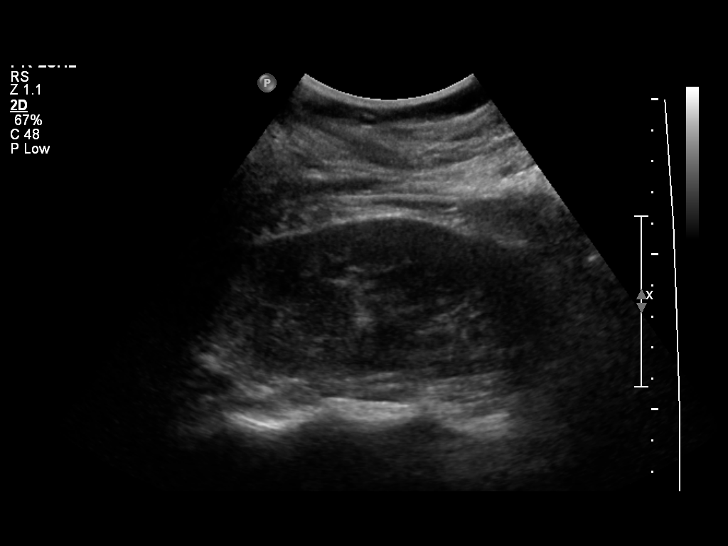
[im 25/37]
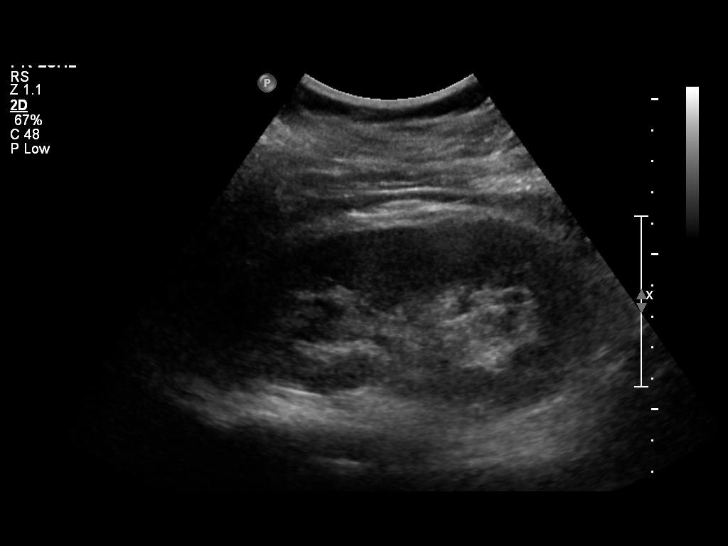
[im 28/37]
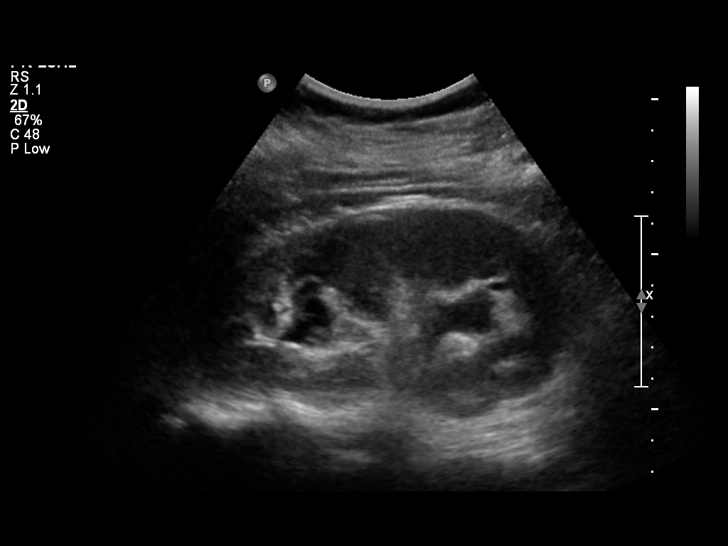
[im 31/37]
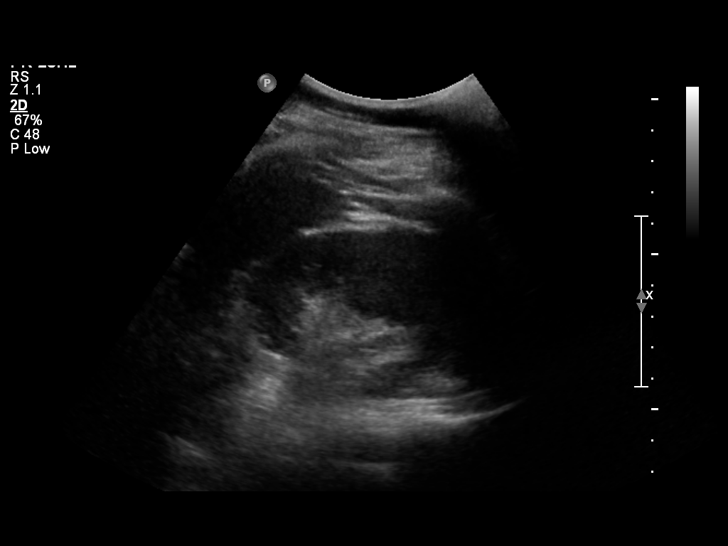
[im 34/37]
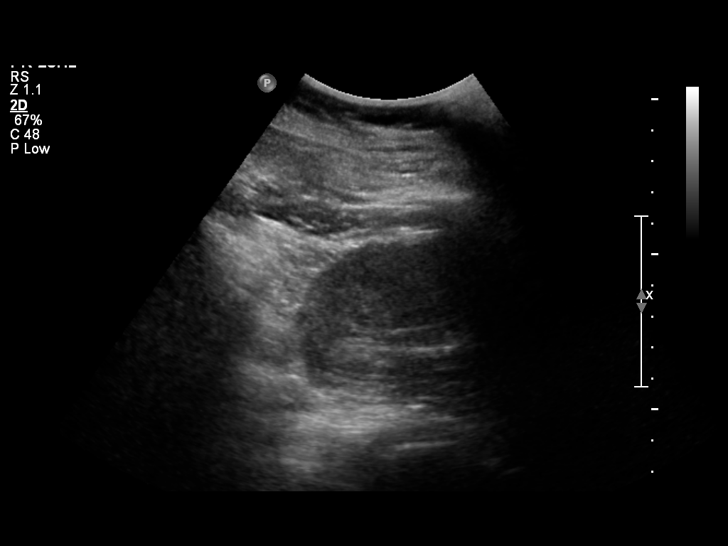
[im 37/37]
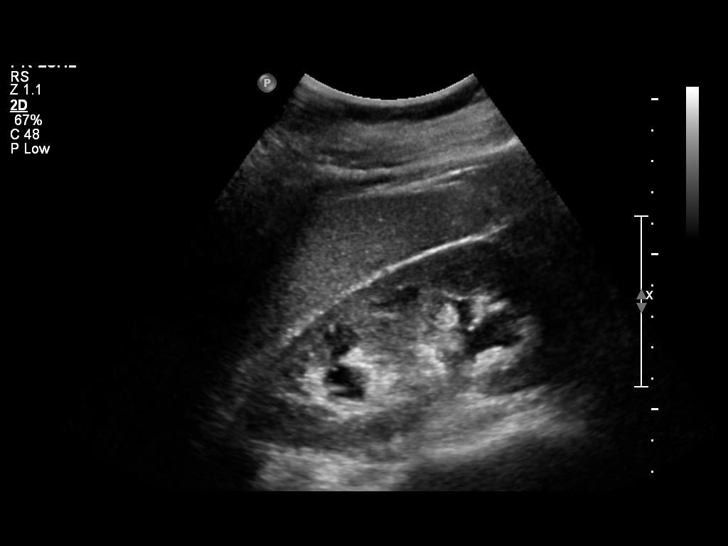

[14 of 25 positions shown; findings below may reference images not displayed]

FINDINGS: Right Kidney:

Length: 11.4 cm.. Echogenicity within normal limits. No mass or
hydronephrosis visualized.

Left Kidney:

Length: 13 cm.. Echogenicity within normal limits. No mass, minimal
hydronephrosis on the left.

Bladder:

Appears normal for degree of bladder distention.
IMPRESSION: Minimal left hydronephrosis.  Exam otherwise unremarkable.

## 2015-05-24 ENCOUNTER — Inpatient Hospital Stay
Admission: EM | Admit: 2015-05-24 | Discharge: 2015-05-27 | DRG: 059 | Disposition: A | Discharge status: Home / Self Care | Payer: BLUE CROSS/BLUE SHIELD | Attending: Internal Medicine | Admitting: Internal Medicine

## 2015-05-24 DIAGNOSIS — G35 Multiple sclerosis: Secondary | ICD-10-CM | POA: Diagnosis present

## 2015-05-24 DIAGNOSIS — R51 Headache: Secondary | ICD-10-CM | POA: Diagnosis not present

## 2015-05-24 DIAGNOSIS — Z6838 Body mass index (BMI) 38.0-38.9, adult: Secondary | ICD-10-CM | POA: Diagnosis not present

## 2015-05-24 DIAGNOSIS — H469 Unspecified optic neuritis: Secondary | ICD-10-CM | POA: Diagnosis not present

## 2015-05-24 DIAGNOSIS — Z885 Allergy status to narcotic agent status: Secondary | ICD-10-CM

## 2015-05-24 DIAGNOSIS — Z91030 Bee allergy status: Secondary | ICD-10-CM | POA: Diagnosis not present

## 2015-05-24 DIAGNOSIS — Z91048 Other nonmedicinal substance allergy status: Secondary | ICD-10-CM | POA: Diagnosis not present

## 2015-05-24 DIAGNOSIS — E669 Obesity, unspecified: Secondary | ICD-10-CM | POA: Diagnosis present

## 2015-05-24 HISTORY — DX: Multiple sclerosis: G35

## 2015-05-24 LAB — COMPREHENSIVE METABOLIC PANEL
ALT: 28 U/L (ref 14–54)
AST: 23 U/L (ref 15–41)
Albumin: 4.3 g/dL (ref 3.5–5.0)
Alkaline Phosphatase: 111 U/L (ref 38–126)
Anion gap: 9 (ref 5–15)
BUN: 13 mg/dL (ref 6–20)
CALCIUM: 9.1 mg/dL (ref 8.9–10.3)
CO2: 25 mmol/L (ref 22–32)
CREATININE: 0.91 mg/dL (ref 0.44–1.00)
Chloride: 105 mmol/L (ref 101–111)
GFR calc Af Amer: >60 mL/min (ref >60)
GFR calc non Af Amer: >60 mL/min (ref >60)
Glucose, Bld: 96 mg/dL (ref 65–99)
Potassium: 3.8 mmol/L (ref 3.5–5.1)
Sodium: 139 mmol/L (ref 135–145)
Total Bilirubin: 0.3 mg/dL (ref 0.3–1.2)
Total Protein: 7.8 g/dL (ref 6.5–8.1)

## 2015-05-24 LAB — CBC WITH DIFFERENTIAL/PLATELET
Basophils Absolute: 0 10*3/uL (ref 0–0.1)
Basophils Relative: 0 %
EOS PCT: 4 %
Eosinophils Absolute: 0.3 10*3/uL (ref 0–0.7)
HCT: 38.5 % (ref 35.0–47.0)
Hemoglobin: 12.7 g/dL (ref 12.0–16.0)
Lymphocytes Relative: 6 %
Lymphs Abs: 0.3 10*3/uL — ABNORMAL LOW (ref 1.0–3.6)
MCH: 25 pg — AB (ref 26.0–34.0)
MCHC: 33 g/dL (ref 32.0–36.0)
MCV: 75.7 fL — AB (ref 80.0–100.0)
Monocytes Absolute: 0.7 10*3/uL (ref 0.2–0.9)
Monocytes Relative: 11 %
Neutro Abs: 4.5 10*3/uL (ref 1.4–6.5)
Neutrophils Relative %: 79 %
PLATELETS: 277 10*3/uL (ref 150–440)
RBC: 5.08 MIL/uL (ref 3.80–5.20)
RDW: 15.5 % — ABNORMAL HIGH (ref 11.5–14.5)
WBC: 5.7 10*3/uL (ref 3.6–11.0)

## 2015-05-24 LAB — URINALYSIS COMPLETE WITH MICROSCOPIC (ARMC ONLY)
Bilirubin Urine: NEGATIVE
GLUCOSE, UA: NEGATIVE mg/dL
Hgb urine dipstick: NEGATIVE
Ketones, ur: NEGATIVE mg/dL
NITRITE: NEGATIVE
PROTEIN: NEGATIVE mg/dL
Specific Gravity, Urine: 1.013 (ref 1.005–1.030)
pH: 7 (ref 5.0–8.0)

## 2015-05-24 LAB — C-REACTIVE PROTEIN: CRP: 0.9 mg/dL (ref <1.0)

## 2015-05-24 LAB — SEDIMENTATION RATE: SED RATE: 12 mm/h (ref 0–20)

## 2015-05-24 MED ORDER — ACETAMINOPHEN 650 MG RE SUPP: 650.0000 mg | Freq: Four times a day (QID) | RECTAL | Status: DC | PRN

## 2015-05-24 MED ORDER — SENNOSIDES-DOCUSATE SODIUM 8.6-50 MG PO TABS: 1.0000 | ORAL_TABLET | Freq: Every evening | ORAL | Status: DC | PRN

## 2015-05-24 MED ORDER — SODIUM CHLORIDE 0.9 % IV SOLN
500.0000 mg | Freq: Two times a day (BID) | INTRAVENOUS | Status: DC
  Administered 2015-05-25 – 2015-05-27 (×5): 500 mg via INTRAVENOUS
  Filled 2015-05-24 (×7): qty 4

## 2015-05-24 MED ORDER — IBUPROFEN 400 MG PO TABS
400.0000 mg | ORAL_TABLET | Freq: Four times a day (QID) | ORAL | Status: DC | PRN
  Administered 2015-05-25 – 2015-05-26 (×2): 400 mg via ORAL
  Filled 2015-05-24 (×2): qty 1

## 2015-05-24 MED ORDER — SODIUM CHLORIDE 0.9 % IV SOLN
500.0000 mg | Freq: Once | INTRAVENOUS | Status: AC
  Administered 2015-05-24: 500 mg via INTRAVENOUS
  Filled 2015-05-24: qty 4

## 2015-05-24 MED ORDER — FINGOLIMOD HCL 0.5 MG PO CAPS
0.5000 mg | ORAL_CAPSULE | Freq: Every day | ORAL | Status: DC
  Administered 2015-05-25 – 2015-05-27 (×3): 0.5 mg via ORAL
  Filled 2015-05-24: qty 1

## 2015-05-24 MED ORDER — ALUM & MAG HYDROXIDE-SIMETH 200-200-20 MG/5ML PO SUSP: 30.0000 mL | Freq: Four times a day (QID) | ORAL | Status: DC | PRN

## 2015-05-24 MED ORDER — ONDANSETRON HCL 4 MG/2ML IJ SOLN: 4.0000 mg | Freq: Four times a day (QID) | INTRAMUSCULAR | Status: DC | PRN

## 2015-05-24 MED ORDER — ZOLPIDEM TARTRATE 5 MG PO TABS
5.0000 mg | ORAL_TABLET | Freq: Every evening | ORAL | Status: DC | PRN
  Administered 2015-05-25: 5 mg via ORAL
  Filled 2015-05-24: qty 1

## 2015-05-24 MED ORDER — ENOXAPARIN SODIUM 40 MG/0.4ML ~~LOC~~ SOLN
40.0000 mg | SUBCUTANEOUS | Status: DC
  Administered 2015-05-24 – 2015-05-26 (×3): 40 mg via SUBCUTANEOUS
  Filled 2015-05-24 (×4): qty 0.4

## 2015-05-24 MED ORDER — ACETAMINOPHEN 325 MG PO TABS: 650.0000 mg | ORAL_TABLET | Freq: Four times a day (QID) | ORAL | Status: DC | PRN

## 2015-05-24 MED ORDER — ONDANSETRON HCL 4 MG PO TABS
4.0000 mg | ORAL_TABLET | Freq: Four times a day (QID) | ORAL | Status: DC | PRN
  Administered 2015-05-25: 4 mg via ORAL
  Filled 2015-05-24: qty 1

## 2015-05-24 NOTE — ED Provider Notes (Signed)
[facility] Emergency Department Provider Note  ____________________________________________  Time seen: Approximately 5:45 PM  I have reviewed the triage vital signs and the nursing notes.   HISTORY  Chief Complaint Eye Pain    HPI Emily Rhodes is a 29 y.o. female with a history of multiple sclerosis and optic neuritis first diagnosed several years ago who presents with right eye pain since yesterday.  She had an acute onset of sharp right eye pain with loss of vision which improved but now she is describing "tunnel vision" in that eye.  She went to [facility] today and saw Dr. Wallace Going.  He diagnosed her with optic neuritis and discussed by phone with Dr. Irish Elders his concerns.  Dr. Irish Elders called the emergency department and recommended admission to the hospitalist with Solu-Medrol 500 mg twice a day and further consultation while she is inpatient.  The patient is in no acute distress upon my evaluation and is still complaining of mild tunnel vision in the right eye but is otherwise asymptomatic.   Past Medical History  Diagnosis Date  . Optic neuritis     rt eye  . Infection     UTI  . Fibroid   . Multiple sclerosis     Patient Active Problem List   Diagnosis Date Noted  . 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 SVD (spontaneous vaginal delivery) 12/16/2013  . Active labor at term 12/15/2013  . Pyelonephritis complicating pregnancy in third trimester, antepartum 11/30/2013    Past Surgical History  Procedure Laterality Date  . Spinal tap    . Wisdom tooth extraction      age 45    No current outpatient prescriptions on file.  Allergies Bee venom; Percocet; and Tape  Family History  Problem Relation Age of Onset  . Diabetes Father   . Hypertension Father   . Hearing loss Neg Hx     Social History History  Substance Use Topics  . Smoking status: Never Smoker   . Smokeless tobacco: Never Used  .  Alcohol Use: No    Review of Systems Constitutional: No fever/chills Eyes: Decreased vision in right eye ENT: No sore throat. Cardiovascular: Denies chest pain. Respiratory: Denies shortness of breath. Gastrointestinal: No abdominal pain.  No nausea, no vomiting.  No diarrhea.  No constipation. Genitourinary: Negative for dysuria. Musculoskeletal: Negative for back pain. Skin: Negative for rash. Neurological: Negative for headaches, focal weakness or numbness.  10-point ROS otherwise negative.  ____________________________________________   PHYSICAL EXAM:  VITAL SIGNS: ED Triage Vitals  Enc Vitals Group     BP 05/24/15 1741 125/81 mmHg     Pulse Rate 05/24/15 1741 78     Resp 05/24/15 1741 16     Temp 05/24/15 1741 98 F (36.7 C)     Temp Source 05/24/15 1741 Oral     SpO2 05/24/15 1741 97 %     Weight 05/24/15 1741 238 lb (107.956 kg)     Height 05/24/15 1741 5' 6"  (1.676 m)     Head Cir --      Peak Flow --      Pain Score 05/24/15 1742 6     Pain Loc --      Pain Edu? --      Excl. in [place]? --     Constitutional: Alert and oriented. Well appearing and in no acute distress. Eyes: Conjunctivae are normal.  EOMI.  Eyes are dilated after exam at the ophthalmologist. Head: Atraumatic.  Nose: No congestion/rhinnorhea. Mouth/Throat: Mucous membranes are moist.  Oropharynx non-erythematous. Neck: No stridor.   Cardiovascular: Normal rate, regular rhythm. Grossly normal heart sounds.  Good peripheral circulation. Respiratory: Normal respiratory effort.  No retractions. Lungs CTAB. Gastrointestinal: Soft and nontender. No distention. No abdominal bruits. No CVA tenderness. Musculoskeletal: No lower extremity tenderness nor edema.  No joint effusions. Neurologic:  Normal speech and language. No gross focal neurologic deficits are appreciated.  Skin:  Skin is warm, dry and intact. No rash noted.   ____________________________________________   LABS (all labs ordered are  listed, but only abnormal results are displayed)  Labs Reviewed  CBC WITH DIFFERENTIAL/PLATELET - Abnormal; Notable for the following:    MCV 75.7 (*)    MCH 25.0 (*)    RDW 15.5 (*)    Lymphs Abs 0.3 (*)    All other components within normal limits  URINALYSIS COMPLETEWITH MICROSCOPIC ([facility] ONLY) - Abnormal; Notable for the following:    Color, Urine STRAW (*)    APPearance CLEAR (*)    Leukocytes, UA 2+ (*)    Bacteria, UA RARE (*)    Squamous Epithelial / LPF 0-5 (*)    All other components within normal limits  COMPREHENSIVE METABOLIC PANEL  SEDIMENTATION RATE  C-REACTIVE PROTEIN  POC URINE PREG, ED   ____________________________________________  EKG  Not indicated ____________________________________________  RADIOLOGY  No results found.  ____________________________________________   PROCEDURES  Procedure(s) performed: None  Critical Care performed: No ____________________________________________   INITIAL IMPRESSION / ASSESSMENT AND PLAN / ED COURSE  Pertinent labs & imaging results that were available during my care of the patient were reviewed by me and considered in my medical decision making (see chart for details).  The patient is stable now and in no acute distress.  He ordered basic labs including a CRP and an ESR.  I have ordered her first dose of Solu-Medrol 500 mg.  I discussed with the hospitalist who will admit.  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

## 2015-05-24 NOTE — H&P (Signed)
[facility] NAME: Emily Rhodes    MR#:  [id number]  DATE OF BIRTH:  1986/01/30  DATE OF ADMISSION:  05/24/2015  PRIMARY CARE PHYSICIAN: Pcp Not In System   REQUESTING/REFERRING PHYSICIAN: Dr. Karma Greaser  CHIEF COMPLAINT:   Chief Complaint  Patient presents with  . Eye Pain    optic neuritis    HISTORY OF PRESENT ILLNESS:  Emily Rhodes  is a 29 y.o. female with a known history of multiple sclerosis presents to the emergency room from her ophthalmologist office due to right-sided tunnel vision. Patient had similar symptoms many years back and later diagnosed with multiple sclerosis. Follows with Dr. Manuella Ghazi of neurology. Started yesterday at 4 PM. No other focal neurological symptoms. Was seen in the ophthalmologist's office earlier today and sent to the emergency room. She has had MRIs in the past with diagnosis of multiple sclerosis. Has received 1 dose of 500 mg IV Solu-Medrol in the emergency room.  PAST MEDICAL HISTORY:   Past Medical History  Diagnosis Date  . Optic neuritis     rt eye  . Infection     UTI  . Fibroid   . Multiple sclerosis     PAST SURGICAL HISTORY:   Past Surgical History  Procedure Laterality Date  . Spinal tap    . Wisdom tooth extraction      age 62    SOCIAL HISTORY:   History  Substance Use Topics  . Smoking status: Never Smoker   . Smokeless tobacco: Never Used  . Alcohol Use: No    FAMILY HISTORY:   Family History  Problem Relation Age of Onset  . Diabetes Father   . Hypertension Father   . Hearing loss Neg Hx     DRUG ALLERGIES:   Allergies  Allergen Reactions  . Bee Venom Swelling  . Percocet [Oxycodone-Acetaminophen] Nausea And Vomiting  . Tape Itching and Rash    REVIEW OF SYSTEMS:   Review of Systems  Constitutional: Negative for fever, chills, weight loss and malaise/fatigue.  HENT: Negative for hearing loss and nosebleeds.   Eyes: Negative for  blurred vision, double vision and pain.  Respiratory: Negative for cough, hemoptysis, sputum production, shortness of breath and wheezing.   Cardiovascular: Negative for chest pain, palpitations, orthopnea and leg swelling.  Gastrointestinal: Negative for nausea, vomiting, abdominal pain, diarrhea and constipation.  Genitourinary: Negative for dysuria and hematuria.  Musculoskeletal: Negative for myalgias, back pain and falls.  Skin: Negative for rash.  Neurological: Positive for dizziness. Negative for tremors, sensory change, speech change, focal weakness, seizures and headaches.       Right eye tunnel vision  Endo/Heme/Allergies: Does not bruise/bleed easily.  Psychiatric/Behavioral: Negative for depression and memory loss. The patient is not nervous/anxious.     MEDICATIONS AT HOME:   Prior to Admission medications   Medication Sig Start Date End Date Taking? Authorizing Provider  Fingolimod HCl (GILENYA) 0.5 MG CAPS Take 0.5 mg by mouth daily.   Yes Historical Provider, MD  HYDROmorphone (DILAUDID) 2 MG tablet Take 1 tablet (2 mg total) by mouth every 4 (four) hours as needed for severe pain. Patient not taking: Reported on 05/24/2015 12/18/13   Newton Pigg, MD  ibuprofen (ADVIL,MOTRIN) 600 MG tablet Take 1 tablet (600 mg total) by mouth every 6 (six) hours as needed. Patient not taking: Reported on 05/24/2015 12/18/13   Newton Pigg, MD      VITAL SIGNS:  Blood pressure 132/88,  pulse 79, temperature 98 F (36.7 C), temperature source Oral, resp. rate 20, height 5\' 6"  (1.676 m), weight 107.956 kg (238 lb), last menstrual period 05/05/2015, SpO2 97 %, unknown if currently breastfeeding.  PHYSICAL EXAMINATION:  Physical Exam  GENERAL:  29 y.o.-year-old patient lying in the bed with no acute distress. Obese. EYES: Pupils equal, round, reactive to light and accommodation. No scleral icterus. Extraocular muscles intact.  HEENT: Head atraumatic, normocephalic. Oropharynx and nasopharynx  clear. No oropharyngeal erythema, moist oral mucosa  NECK:  Supple, no jugular venous distention. No thyroid enlargement, no tenderness.  LUNGS: Normal breath sounds bilaterally, no wheezing, rales, rhonchi. No use of accessory muscles of respiration.  CARDIOVASCULAR: S1, S2 normal. No murmurs, rubs, or gallops.  ABDOMEN: Soft, nontender, nondistended. Bowel sounds present. No organomegaly or mass.  EXTREMITIES: No pedal edema, cyanosis, or clubbing. + 2 pedal & radial pulses b/l.   NEUROLOGIC: Cranial nerves II through XII are intact. No focal Motor or sensory deficits appreciated b/l PSYCHIATRIC: The patient is alert and oriented x 3. Good affect.  SKIN: No obvious rash, lesion, or ulcer.   LABORATORY PANEL:   CBC  Recent Labs Lab 05/24/15 1809  WBC 5.7  HGB 12.7  HCT 38.5  PLT 277   ------------------------------------------------------------------------------------------------------------------  Chemistries  No results for input(s): NA, K, CL, CO2, GLUCOSE, BUN, CREATININE, CALCIUM, MG, AST, ALT, ALKPHOS, BILITOT in the last 168 hours.  Invalid input(s): GFRCGP ------------------------------------------------------------------------------------------------------------------  Cardiac Enzymes No results for input(s): TROPONINI in the last 168 hours. ------------------------------------------------------------------------------------------------------------------  RADIOLOGY:  No results found.   IMPRESSION AND PLAN:   * Optic neuritis with multiple sclerosis. She has been seen by ophthalmology as outpatient today. Case discussed with neurology over the phone.. Admit as inpatient. Started on high-dose Solu-Medrol 500 mg IV 2 times a day. Consult neurology and ophthalmology to see the patient in the hospital. Pain medications as needed  DVT prophylaxis with Lovenox    All the records are reviewed and case discussed with ED provider. Management plans discussed with  the patient, family and they are in agreement.  CODE STATUS: FULL  TOTAL TIME TAKING CARE OF THIS PATIENT: 40 minutes.    Hillary Bow R M.D on 05/24/2015 at 6:51 PM  Between 7am to 6pm - Pager - [phone]  After 6pm go to [email or web address] - password [email or web address]  Office  [phone]  CC: Primary care physician; Pcp Not In System

## 2015-05-24 NOTE — ED Notes (Signed)
Adm md with pt 

## 2015-05-24 NOTE — ED Notes (Signed)
Pt was sent to the ED from [facility] with right optic neuritis for admission with iv steroids

## 2015-05-25 ENCOUNTER — Inpatient Hospital Stay: Payer: BLUE CROSS/BLUE SHIELD

## 2015-05-25 DIAGNOSIS — H469 Unspecified optic neuritis: Secondary | ICD-10-CM

## 2015-05-25 DIAGNOSIS — G35 Multiple sclerosis: Principal | ICD-10-CM

## 2015-05-25 LAB — PREGNANCY, URINE: PREG TEST UR: NEGATIVE

## 2015-05-25 MED ORDER — BUTALBITAL-APAP-CAFFEINE 50-325-40 MG PO TABS
1.0000 | ORAL_TABLET | ORAL | Status: DC | PRN
  Administered 2015-05-25: 1 via ORAL
  Filled 2015-05-25: qty 1

## 2015-05-25 MED ORDER — MAGNESIUM SULFATE 2 GM/50ML IV SOLN
2.0000 g | Freq: Once | INTRAVENOUS | Status: AC
  Administered 2015-05-25: 2 g via INTRAVENOUS
  Filled 2015-05-25: qty 50

## 2015-05-25 MED ORDER — HYDROCODONE-ACETAMINOPHEN 5-325 MG PO TABS
1.0000 | ORAL_TABLET | Freq: Four times a day (QID) | ORAL | Status: DC | PRN
  Administered 2015-05-25 – 2015-05-26 (×4): 1 via ORAL
  Filled 2015-05-25 (×4): qty 1

## 2015-05-25 MED ORDER — GADOBENATE DIMEGLUMINE 529 MG/ML IV SOLN
20.0000 mL | Freq: Once | INTRAVENOUS | Status: AC | PRN
  Administered 2015-05-25: 20 mL via INTRAVENOUS

## 2015-05-25 MED ORDER — VALPROIC ACID 250 MG PO CAPS
500.0000 mg | ORAL_CAPSULE | Freq: Once | ORAL | Status: AC
  Administered 2015-05-25: 500 mg via ORAL
  Filled 2015-05-25: qty 2

## 2015-05-25 NOTE — Progress Notes (Addendum)
Emily Rhodes states, "I feel like I could run a marathon right now on my insides," requested medication to help her rest. MD notified, Hospitalist on-call placed order for ambien 5mg .

## 2015-05-25 NOTE — Consult Note (Signed)
CC: multiple sclerosis exacerbation   HPI: Emily Rhodes is an 29 y.o. female known history of multiple sclerosis presents to the emergency room from her ophthalmologist office due to right-sided tunnel vision. Patient had similar symptoms many years back and later diagnosed with multiple sclerosis. Follows with Dr. Manuella Ghazi of neurology. Started gelenia about 3 months ago.  Last imaging was done about 1 yr ago.  Started on solumedrol.   Past Medical History  Diagnosis Date  . Optic neuritis     rt eye  . Infection     UTI  . Fibroid   . Multiple sclerosis     Past Surgical History  Procedure Laterality Date  . Spinal tap    . Wisdom tooth extraction      age 68    Family History  Problem Relation Age of Onset  . Diabetes Father   . Hypertension Father   . Hearing loss Neg Hx     Social History:  reports that she has never smoked. She has never used smokeless tobacco. She reports that she does not drink alcohol or use illicit drugs.  Allergies  Allergen Reactions  . Bee Venom Swelling  . Percocet [Oxycodone-Acetaminophen] Nausea And Vomiting  . Tape Itching and Rash    Medications: I have reviewed the patient's current medications.  ROS: History obtained from the patient  General ROS: negative for - chills, fatigue, fever, night sweats, weight gain or weight loss Psychological ROS: negative for - behavioral disorder, hallucinations, memory difficulties, mood swings or suicidal ideation Ophthalmic ROS: negative for - blurry vision, double vision, eye pain or loss of vision ENT ROS: negative for - epistaxis, nasal discharge, oral lesions, sore throat, tinnitus or vertigo Allergy and Immunology ROS: negative for - hives or itchy/watery eyes Hematological and Lymphatic ROS: negative for - bleeding problems, bruising or swollen lymph nodes Endocrine ROS: negative for - galactorrhea, hair pattern changes, polydipsia/polyuria or temperature intolerance Respiratory ROS:  negative for - cough, hemoptysis, shortness of breath or wheezing Cardiovascular ROS: negative for - chest pain, dyspnea on exertion, edema or irregular heartbeat Gastrointestinal ROS: negative for - abdominal pain, diarrhea, hematemesis, nausea/vomiting or stool incontinence Genito-Urinary ROS: negative for - dysuria, hematuria, incontinence or urinary frequency/urgency Musculoskeletal ROS: negative for - joint swelling or muscular weakness Neurological ROS: as noted in HPI Dermatological ROS: negative for rash and skin lesion changes  Physical Examination: Blood pressure 114/72, pulse 73, temperature 97.6 F (36.4 C), temperature source Oral, resp. rate 16, height 5\' 6"  (1.676 m), weight 107.956 kg (238 lb), last menstrual period 05/05/2015, SpO2 99 %, unknown if currently breastfeeding.  Neurological Examination Mental Status: Alert, oriented, thought content appropriate.  Speech fluent without evidence of aphasia.  Able to follow 3 step commands without difficulty. Cranial Nerves: II: Discs flat bilaterally; Decreased R peripheral vision.  III,IV, VI: ptosis not present, extra-ocular motions intact bilaterally, ? Mild R ptosis.  V,VII: smile symmetric, facial light touch sensation normal bilaterally VIII: hearing normal bilaterally IX,X: gag reflex present XI: bilateral shoulder shrug XII: midline tongue extension Motor: Right : Upper extremity   5/5    Left:     Upper extremity   5/5  Lower extremity   5/5     Lower extremity   5/5 Tone and bulk:normal tone throughout; no atrophy noted Sensory: Pinprick and light touch intact throughout, bilaterally Deep Tendon Reflexes: 3+ and symmetric throughout Plantars: Right: downgoing   Left: downgoing Cerebellar: normal finger-to-nose, normal rapid alternating movements and normal heel-to-shin  test Gait: normal gait and station      Laboratory Studies:   Basic Metabolic Panel:  Recent Labs Lab 05/24/15 1809  NA 139  K 3.8   CL 105  CO2 25  GLUCOSE 96  BUN 13  CREATININE 0.91  CALCIUM 9.1    Liver Function Tests:  Recent Labs Lab 05/24/15 1809  AST 23  ALT 28  ALKPHOS 111  BILITOT 0.3  PROT 7.8  ALBUMIN 4.3   No results for input(s): LIPASE, AMYLASE in the last 168 hours. No results for input(s): AMMONIA in the last 168 hours.  CBC:  Recent Labs Lab 05/24/15 1809  WBC 5.7  NEUTROABS 4.5  HGB 12.7  HCT 38.5  MCV 75.7*  PLT 277    Cardiac Enzymes: No results for input(s): CKTOTAL, CKMB, CKMBINDEX, TROPONINI in the last 168 hours.  BNP: Invalid input(s): POCBNP  CBG: No results for input(s): GLUCAP in the last 168 hours.  Microbiology: Results for orders placed or performed during the hospital encounter of 12/04/13  Urine culture     Status: None   Collection Time: 12/04/13 12:30 PM  Result Value Ref Range Status   Specimen Description URINE, RANDOM  Final   Special Requests NONE  Final   Culture  Setup Time   Final    12/04/2013 18:28 Performed at [facility] Performed at [facility]  Final   Culture NO GROWTH Performed at [facility]  Final   Report Status 12/05/2013 FINAL  Final    Coagulation Studies: No results for input(s): LABPROT, INR in the last 72 hours.  Urinalysis:  Recent Labs Lab 05/24/15 1955  [place] 1.013  PHURINE 7.0  GLUCOSEU NEGATIVE  HGBUR NEGATIVE  BILIRUBINUR NEGATIVE  KETONESUR NEGATIVE  PROTEINUR NEGATIVE  NITRITE NEGATIVE  LEUKOCYTESUR 2+*    Lipid Panel:  No results found for: CHOL, TRIG, HDL, CHOLHDL, VLDL, LDLCALC  HgbA1C: No results found for: HGBA1C  Urine Drug Screen:  No results found for: LABOPIA, COCAINSCRNUR, LABBENZ, AMPHETMU, THCU, LABBARB  Alcohol Level: No results for input(s): ETH in the last 168 hours.  Imaging: No results found.   Assessment/Plan:  29 y.o. female known history of multiple sclerosis presents to the emergency room  from her ophthalmologist office due to right-sided tunnel vision. Patient had similar symptoms many years back and later diagnosed with multiple sclerosis. Follows with Dr. Manuella Ghazi of neurology. Started gelenia about 3 months ago.  Last imaging was done about 1 yr ago.  Started on solumedrol for optic neuritis.    - con't solumedrol 500 q12hrs - Mg 2gm now due to headache - last MRI was 1 yr ago, will obtain MRI/MRA - will f/up Dr. Manuella Ghazi after 3 days of steroids.  Leotis Pain  05/25/2015, 1:28 PM

## 2015-05-25 NOTE — Progress Notes (Signed)
Dr. Irish Elders Pt. C/o headache unrelieved by ibuprofen and Fioricet. Dr. Irish Elders ordered VPA 500mg  once and norco 5/325 q6h prn for headache. Do not give pt. Anymore Fioricet per Dr. Irish Elders.

## 2015-05-25 NOTE — Progress Notes (Signed)
[place] [facility] [place] NAME: Emily Rhodes    MR#:  [id number]  DATE OF BIRTH:  January 07, 1986  SUBJECTIVE:  CHIEF COMPLAINT:  Resting comfortably but still complaining of tunnel vision. Intermittent episodes of headache  REVIEW OF SYSTEMS:  CONSTITUTIONAL: No fever, fatigue or weakness.  EYES: No blurred or double vision. Reporting tunnel vision and headache EARS, NOSE, AND THROAT: No tinnitus or ear pain.  RESPIRATORY: No cough, shortness of breath, wheezing or hemoptysis.  CARDIOVASCULAR: No chest pain, orthopnea, edema.  GASTROINTESTINAL: No nausea, vomiting, diarrhea or abdominal pain.  GENITOURINARY: No dysuria, hematuria.  ENDOCRINE: No polyuria, nocturia,  HEMATOLOGY: No anemia, easy bruising or bleeding SKIN: No rash or lesion. MUSCULOSKELETAL: No joint pain or arthritis.   NEUROLOGIC: No tingling, numbness, weakness.  PSYCHIATRY: No anxiety or depression.   DRUG ALLERGIES:   Allergies  Allergen Reactions  . Bee Venom Swelling  . Percocet [Oxycodone-Acetaminophen] Nausea And Vomiting  . Tape Itching and Rash    VITALS:  Blood pressure 114/72, pulse 73, temperature 97.6 F (36.4 C), temperature source Oral, resp. rate 16, height 5\' 6"  (1.676 m), weight 107.956 kg (238 lb), last menstrual period 05/05/2015, SpO2 99 %, unknown if currently breastfeeding.  PHYSICAL EXAMINATION:  GENERAL:  29 y.o.-year-old patient lying in the bed with no acute distress.  EYES: Pupils equal, round, reactive to light and accommodation. No scleral icterus. Extraocular muscles intact.  HEENT: Head atraumatic, normocephalic. Oropharynx and nasopharynx clear.  NECK:  Supple, no jugular venous distention. No thyroid enlargement, no tenderness.  LUNGS: Normal breath sounds bilaterally, no wheezing, rales,rhonchi or crepitation. No use of accessory muscles of respiration.  CARDIOVASCULAR: S1, S2 normal. No murmurs, rubs, or gallops.  ABDOMEN: Soft,  nontender, nondistended. Bowel sounds present. No organomegaly or mass.  EXTREMITIES: No pedal edema, cyanosis, or clubbing.  NEUROLOGIC: Cranial nerves II through XII are intact. Muscle strength 5/5 in all extremities. Sensation intact. Gait not checked.  PSYCHIATRIC: The patient is alert and oriented x 3.  SKIN: No obvious rash, lesion, or ulcer.    LABORATORY PANEL:   CBC  Recent Labs Lab 05/24/15 1809  WBC 5.7  HGB 12.7  HCT 38.5  PLT 277   ------------------------------------------------------------------------------------------------------------------  Chemistries   Recent Labs Lab 05/24/15 1809  NA 139  K 3.8  CL 105  CO2 25  GLUCOSE 96  BUN 13  CREATININE 0.91  CALCIUM 9.1  AST 23  ALT 28  ALKPHOS 111  BILITOT 0.3   ------------------------------------------------------------------------------------------------------------------  Cardiac Enzymes No results for input(s): TROPONINI in the last 168 hours. ------------------------------------------------------------------------------------------------------------------  RADIOLOGY:  No results found.  EKG:  No orders found for this or any previous visit.  ASSESSMENT AND PLAN:   * Optic neuritis with multiple sclerosis She has been seen by ophthalmology as outpatient . Continue high-dose Solu-Medrol 500 mg IV 2 times a day for 3 days  Appreciate neurology consult and ophthalmology consult is pending  to see the patient in the hospital. Pain medications as needed  *Obesity Lifestyle changes are advised  DVT prophylaxis with Lovenox     All the records are reviewed and case discussed with Care Management/Social Workerr. Management plans discussed with the patient, family and they are in agreement.  CODE STATUS: full  TOTAL TIME TAKING CARE OF THIS PATIENT: 35  minutes.   POSSIBLE D/C IN 2-3  DAYS, DEPENDING ON CLINICAL CONDITION.   Nicholes Mango M.D on 05/25/2015 at 1:49 PM  Between 7am  to  6pm - Pager - [phone] After 6pm go to [email or web address] - password [email or web address] [facility]  Office  [phone]  CC: Primary care physician; Pcp Not In System

## 2015-05-25 NOTE — Progress Notes (Addendum)
Anjel admitted from ED, A&Ox4, independent. Oriented to unit, room and staff. Denies pain. Plan of care discussed. To receive IV steroids. Spouse at bedside. Nursing continues to monitor and assist as needed.

## 2015-05-26 MED ORDER — BUTALBITAL-APAP-CAFFEINE 50-325-40 MG PO TABS
2.0000 | ORAL_TABLET | Freq: Four times a day (QID) | ORAL | Status: DC | PRN
  Administered 2015-05-26 – 2015-05-27 (×2): 2 via ORAL
  Filled 2015-05-26 (×2): qty 2

## 2015-05-26 MED ORDER — MAGNESIUM SULFATE 2 GM/50ML IV SOLN
2.0000 g | Freq: Once | INTRAVENOUS | Status: AC
  Administered 2015-05-26: 2 g via INTRAVENOUS
  Filled 2015-05-26: qty 50

## 2015-05-26 MED ORDER — VALPROATE SODIUM 500 MG/5ML IV SOLN
500.0000 mg | Freq: Once | INTRAVENOUS | Status: AC
  Administered 2015-05-26: 500 mg via INTRAVENOUS
  Filled 2015-05-26 (×2): qty 5

## 2015-05-26 NOTE — Progress Notes (Signed)
[facility] at [place] NAME: Emily Rhodes    MR#:  [id number]  DATE OF BIRTH:  01/06/1986  SUBJECTIVE:  CHIEF COMPLAINT:  Resting comfortably , reports improving  of tunnel vision. Headache is improved  REVIEW OF SYSTEMS:  CONSTITUTIONAL: No fever, fatigue or weakness.  EYES: No blurred or double vision. Tunnel vision is better headache is resolved EARS, NOSE, AND THROAT: No tinnitus or ear pain.  RESPIRATORY: No cough, shortness of breath, wheezing or hemoptysis.  CARDIOVASCULAR: No chest pain, orthopnea, edema.  GASTROINTESTINAL: No nausea, vomiting, diarrhea or abdominal pain.  GENITOURINARY: No dysuria, hematuria.  ENDOCRINE: No polyuria, nocturia,  HEMATOLOGY: No anemia, easy bruising or bleeding SKIN: No rash or lesion. MUSCULOSKELETAL: No joint pain or arthritis.   NEUROLOGIC: No tingling, numbness, weakness.  PSYCHIATRY: No anxiety or depression.   DRUG ALLERGIES:   Allergies  Allergen Reactions  . Bee Venom Swelling  . Percocet [Oxycodone-Acetaminophen] Nausea And Vomiting  . Tape Itching and Rash    VITALS:  Blood pressure 121/75, pulse 67, temperature 98.4 F (36.9 C), temperature source Oral, resp. rate 18, height 5\' 6"  (1.676 m), weight 107.956 kg (238 lb), last menstrual period 05/05/2015, SpO2 100 %, unknown if currently breastfeeding.  PHYSICAL EXAMINATION:  GENERAL:  29 y.o.-year-old patient lying in the bed with no acute distress.  EYES: Pupils equal, round, reactive to light and accommodation. No scleral icterus. Extraocular muscles intact.  HEENT: Head atraumatic, normocephalic. Oropharynx and nasopharynx clear.  NECK:  Supple, no jugular venous distention. No thyroid enlargement, no tenderness.  LUNGS: Normal breath sounds bilaterally, no wheezing, rales,rhonchi or crepitation. No use of accessory muscles of respiration.  CARDIOVASCULAR: S1, S2 normal. No murmurs, rubs, or gallops.  ABDOMEN: Soft,  nontender, nondistended. Bowel sounds present. No organomegaly or mass.  EXTREMITIES: No pedal edema, cyanosis, or clubbing.  NEUROLOGIC: Cranial nerves II through XII are intact. Muscle strength 5/5 in all extremities. Sensation intact. Gait not checked.  PSYCHIATRIC: The patient is alert and oriented x 3.  SKIN: No obvious rash, lesion, or ulcer.    LABORATORY PANEL:   CBC  Recent Labs Lab 05/24/15 1809  WBC 5.7  HGB 12.7  HCT 38.5  PLT 277   ------------------------------------------------------------------------------------------------------------------  Chemistries   Recent Labs Lab 05/24/15 1809  NA 139  K 3.8  CL 105  CO2 25  GLUCOSE 96  BUN 13  CREATININE 0.91  CALCIUM 9.1  AST 23  ALT 28  ALKPHOS 111  BILITOT 0.3   ------------------------------------------------------------------------------------------------------------------  Cardiac Enzymes No results for input(s): TROPONINI in the last 168 hours. ------------------------------------------------------------------------------------------------------------------  RADIOLOGY:  Mr [name] Contrast  05/25/2015   CLINICAL DATA:  29 year old female with history of multiple sclerosis with new right side tunnel vision. Initial encounter.  EXAM: MRI HEAD WITHOUT AND WITH CONTRAST  TECHNIQUE: Multiplanar, multiecho pulse sequences of the brain and surrounding structures were obtained without and with intravenous contrast.  CONTRAST:  72mL MULTIHANCE GADOBENATE DIMEGLUMINE 529 MG/ML IV SOLN in conjunction with contrast enhanced imaging of the cervical spine reported separately.  COMPARISON:  Brain MRI 05/01/2014.  FINDINGS: Cerebral volume remains normal. Major intracranial vascular flow voids are stable and within normal limits. No restricted diffusion to suggest acute infarction. No midline shift, mass effect, evidence of mass lesion, ventriculomegaly, extra-axial collection or acute intracranial hemorrhage.  Cervicomedullary junction and pituitary are within normal limits.  Unchanged tiny right superior frontal lobe subcortical white matter lesion, nonspecific (series 6, image  20 today). There is also a tiny nonspecific subcortical white matter FLAIR hyperintense lesion on series 6 image 17 in the left frontal lobe. On sagittal FLAIR imaging, no other white matter lesion is identified. The corpus callosum appears normal. Deep gray matter nuclei, brainstem, and cerebellum remain normal. No abnormal enhancement identified.  The optic chiasm, optic radiations, and occipital lobes appear within normal limits. No definite asymmetry of the optic nerves on these routine brain images. However, both optic nerves appear prominent on diffusion weighted imaging (series 100, image 22, compared to 2015 diffusion study image 11). The globes and other intraorbital soft tissues appear normal.  Grossly normal internal auditory structures. Visualized paranasal sinuses and mastoids are clear. Visualized scalp soft tissues are within normal limits. Normal bone marrow signal. Cervical spine findings today are reported separately.  IMPRESSION: 1. Continued minimal cerebral white matter signal changes which are nonspecific. 2. Dedicated Orbit MRI (without and with contrast) would be more sensitive and specific with regard to the possibility of optic neuritis, but I do note new diffusion signal in both optic nerves today which may indicate optic nerve edema. 3. Otherwise normal MRI appearance of the brain.   Electronically Signed   By: Genevie Ann M.D.   On: 05/25/2015 16:39   Mr Cervical Spine W Wo Contrast  05/25/2015   CLINICAL DATA:  29 year old female with history of multiple sclerosis with new right side tunnel vision. Initial encounter.  EXAM: MRI CERVICAL SPINE WITHOUT AND WITH CONTRAST  TECHNIQUE: Multiplanar and multiecho pulse sequences of the cervical spine, to include the craniocervical junction and cervicothoracic junction, were  obtained according to standard protocol without and with intravenous contrast.  CONTRAST:  20mL MULTIHANCE GADOBENATE DIMEGLUMINE 529 MG/ML IV SOLN in conjunction with contrast enhanced imaging of the brain reported separately.  COMPARISON:  MRI brain and cervical spine 05/01/2014 and 05/07/2014, respectively  FINDINGS: Stable straightening of cervical lordosis. Cervicomedullary junction is within normal limits. No cervical spinal cord signal abnormality. Normal cord morphology. No abnormal enhancement identified.  Negative paraspinal soft tissues. Major vascular flow voids in the neck appear normal.  Minimal to mild cervical spine degenerative changes appear stable, with no cervical spinal or foraminal stenosis. As before, There is mild disc bulging at C5-C6 with a tiny central disc protrusion best seen on series 7, image 17 today.  Visualized upper thoracic spinal cord and spinal canal appear normal.  IMPRESSION: 1. Stable and normal MRI appearance of the cervical spinal cord. 2. Stable minimal cervical spine degeneration with no spinal or foraminal stenosis.   Electronically Signed   By: Genevie Ann M.D.   On: 05/25/2015 16:31    EKG:  No orders found for this or any previous visit.  ASSESSMENT AND PLAN:   * Optic neuritis with multiple sclerosis She has been seen by ophthalmology as outpatient . Continue high-dose Solu-Medrol 500 mg IV 2 times a day for 3 days  Appreciate neurology consult and ophthalmology consult is pending  to see the patient in the hospital. Pain medications as needed  * Headache MRI of the brain and C-spine are normal Status post magnesium sulfate yesterday and today. Improved with V PA, on Percocet every 6 hours Continue high-dose Solu-Medrol 500 mg IV 2 times a day for 3 days, started yesterday   *Obesity Lifestyle changes are advised  DVT prophylaxis with Lovenox     All the records are reviewed and case discussed with Care Management/Social Workerr. Management  plans discussed with the patient, family  and they are in agreement.  CODE STATUS: full  TOTAL TIME TAKING CARE OF THIS PATIENT: 35  minutes.   POSSIBLE D/C IN 2-3  DAYS, DEPENDING ON CLINICAL CONDITION.   Nicholes Mango M.D on 05/26/2015 at 12:57 PM  Between 7am to 6pm - Pager - [phone] After 6pm go to [email or web address] - password [email or web address] [facility]  Office  [phone]  CC: Primary care physician; Pcp Not In System

## 2015-05-26 NOTE — Progress Notes (Signed)
Pt complaining of headache. She states she is throwing up because of headache. Dr. Volanda Napoleon will place order for medication

## 2015-05-26 NOTE — Consult Note (Addendum)
CC: multiple sclerosis exacerbation   HPI: Emily Rhodes is an 29 y.o. female known history of multiple sclerosis presents to the emergency room from her ophthalmologist office due to right-sided tunnel vision. Patient had similar symptoms many years back and later diagnosed with multiple sclerosis. Follows with Dr. Manuella Ghazi of neurology. Started gelenia about 3 months ago.  Last imaging was done about 1 yr ago.  Started on solumedrol.   Past Medical History  Diagnosis Date  . Optic neuritis     rt eye  . Infection     UTI  . Fibroid   . Multiple sclerosis     Past Surgical History  Procedure Laterality Date  . Spinal tap    . Wisdom tooth extraction      age 89    Family History  Problem Relation Age of Onset  . Diabetes Father   . Hypertension Father   . Hearing loss Neg Hx     Social History:  reports that she has never smoked. She has never used smokeless tobacco. She reports that she does not drink alcohol or use illicit drugs.  Allergies  Allergen Reactions  . Bee Venom Swelling  . Percocet [Oxycodone-Acetaminophen] Nausea And Vomiting  . Tape Itching and Rash    Medications: I have reviewed the patient's current medications.  ROS: History obtained from the patient  General ROS: negative for - chills, fatigue, fever, night sweats, weight gain or weight loss Psychological ROS: negative for - behavioral disorder, hallucinations, memory difficulties, mood swings or suicidal ideation Ophthalmic ROS: negative for - blurry vision, double vision, eye pain or loss of vision ENT ROS: negative for - epistaxis, nasal discharge, oral lesions, sore throat, tinnitus or vertigo Allergy and Immunology ROS: negative for - hives or itchy/watery eyes Hematological and Lymphatic ROS: negative for - bleeding problems, bruising or swollen lymph nodes Endocrine ROS: negative for - galactorrhea, hair pattern changes, polydipsia/polyuria or temperature intolerance Respiratory ROS:  negative for - cough, hemoptysis, shortness of breath or wheezing Cardiovascular ROS: negative for - chest pain, dyspnea on exertion, edema or irregular heartbeat Gastrointestinal ROS: negative for - abdominal pain, diarrhea, hematemesis, nausea/vomiting or stool incontinence Genito-Urinary ROS: negative for - dysuria, hematuria, incontinence or urinary frequency/urgency Musculoskeletal ROS: negative for - joint swelling or muscular weakness Neurological ROS: as noted in HPI Dermatological ROS: negative for rash and skin lesion changes  Physical Examination: Blood pressure 121/75, pulse 67, temperature 98.4 F (36.9 C), temperature source Oral, resp. rate 18, height 5\' 6"  (1.676 m), weight 107.956 kg (238 lb), last menstrual period 05/05/2015, SpO2 100 %, unknown if currently breastfeeding.  Neurological Examination Mental Status: Alert, oriented, thought content appropriate.  Speech fluent without evidence of aphasia.  Able to follow 3 step commands without difficulty. Cranial Nerves: II: Discs flat bilaterally; Decreased R peripheral vision.  III,IV, VI: ptosis not present, extra-ocular motions intact bilaterally, ? Mild R ptosis.  V,VII: smile symmetric, facial light touch sensation normal bilaterally VIII: hearing normal bilaterally IX,X: gag reflex present XI: bilateral shoulder shrug XII: midline tongue extension Motor: Right : Upper extremity   5/5    Left:     Upper extremity   5/5  Lower extremity   5/5     Lower extremity   5/5 Tone and bulk:normal tone throughout; no atrophy noted Sensory: Pinprick and light touch intact throughout, bilaterally Deep Tendon Reflexes: 3+ and symmetric throughout Plantars: Right: downgoing   Left: downgoing Cerebellar: normal finger-to-nose, normal rapid alternating movements and normal heel-to-shin  test Gait: normal gait and station      Laboratory Studies:   Basic Metabolic Panel:  Recent Labs Lab 05/24/15 1809  NA 139  K 3.8   CL 105  CO2 25  GLUCOSE 96  BUN 13  CREATININE 0.91  CALCIUM 9.1    Liver Function Tests:  Recent Labs Lab 05/24/15 1809  AST 23  ALT 28  ALKPHOS 111  BILITOT 0.3  PROT 7.8  ALBUMIN 4.3   No results for input(s): LIPASE, AMYLASE in the last 168 hours. No results for input(s): AMMONIA in the last 168 hours.  CBC:  Recent Labs Lab 05/24/15 1809  WBC 5.7  NEUTROABS 4.5  HGB 12.7  HCT 38.5  MCV 75.7*  PLT 277    Cardiac Enzymes: No results for input(s): CKTOTAL, CKMB, CKMBINDEX, TROPONINI in the last 168 hours.  BNP: Invalid input(s): POCBNP  CBG: No results for input(s): GLUCAP in the last 168 hours.  Microbiology: Results for orders placed or performed during the hospital encounter of 12/04/13  Urine culture     Status: None   Collection Time: 12/04/13 12:30 PM  Result Value Ref Range Status   Specimen Description URINE, RANDOM  Final   Special Requests NONE  Final   Culture  Setup Time   Final    12/04/2013 18:28 Performed at [place] Performed at [facility]  Final   Culture NO GROWTH Performed at [facility]  Final   Report Status 12/05/2013 FINAL  Final    Coagulation Studies: No results for input(s): LABPROT, INR in the last 72 hours.  Urinalysis:   Recent Labs Lab 05/24/15 1955  [place] 1.013  PHURINE 7.0  GLUCOSEU NEGATIVE  HGBUR NEGATIVE  BILIRUBINUR NEGATIVE  KETONESUR NEGATIVE  PROTEINUR NEGATIVE  NITRITE NEGATIVE  LEUKOCYTESUR 2+*    Lipid Panel:  No results found for: CHOL, TRIG, HDL, CHOLHDL, VLDL, LDLCALC  HgbA1C: No results found for: HGBA1C  Urine Drug Screen:  No results found for: LABOPIA, COCAINSCRNUR, LABBENZ, AMPHETMU, THCU, LABBARB  Alcohol Level: No results for input(s): ETH in the last 168 hours.  Imaging: Mr [name] Contrast  05/25/2015   CLINICAL DATA:  29 year old female with history of multiple sclerosis with new right  side tunnel vision. Initial encounter.  EXAM: MRI HEAD WITHOUT AND WITH CONTRAST  TECHNIQUE: Multiplanar, multiecho pulse sequences of the brain and surrounding structures were obtained without and with intravenous contrast.  CONTRAST:  56mL MULTIHANCE GADOBENATE DIMEGLUMINE 529 MG/ML IV SOLN in conjunction with contrast enhanced imaging of the cervical spine reported separately.  COMPARISON:  Brain MRI 05/01/2014.  FINDINGS: Cerebral volume remains normal. Major intracranial vascular flow voids are stable and within normal limits. No restricted diffusion to suggest acute infarction. No midline shift, mass effect, evidence of mass lesion, ventriculomegaly, extra-axial collection or acute intracranial hemorrhage. Cervicomedullary junction and pituitary are within normal limits.  Unchanged tiny right superior frontal lobe subcortical white matter lesion, nonspecific (series 6, image 20 today). There is also a tiny nonspecific subcortical white matter FLAIR hyperintense lesion on series 6 image 17 in the left frontal lobe. On sagittal FLAIR imaging, no other white matter lesion is identified. The corpus callosum appears normal. Deep gray matter nuclei, brainstem, and cerebellum remain normal. No abnormal enhancement identified.  The optic chiasm, optic radiations, and occipital lobes appear within normal limits. No definite asymmetry of the optic nerves on these routine brain images. However,  both optic nerves appear prominent on diffusion weighted imaging (series 100, image 22, compared to 2015 diffusion study image 11). The globes and other intraorbital soft tissues appear normal.  Grossly normal internal auditory structures. Visualized paranasal sinuses and mastoids are clear. Visualized scalp soft tissues are within normal limits. Normal bone marrow signal. Cervical spine findings today are reported separately.  IMPRESSION: 1. Continued minimal cerebral white matter signal changes which are nonspecific. 2.  Dedicated Orbit MRI (without and with contrast) would be more sensitive and specific with regard to the possibility of optic neuritis, but I do note new diffusion signal in both optic nerves today which may indicate optic nerve edema. 3. Otherwise normal MRI appearance of the brain.   Electronically Signed   By: Genevie Ann M.D.   On: 05/25/2015 16:39   Mr Cervical Spine W Wo Contrast  05/25/2015   CLINICAL DATA:  29 year old female with history of multiple sclerosis with new right side tunnel vision. Initial encounter.  EXAM: MRI CERVICAL SPINE WITHOUT AND WITH CONTRAST  TECHNIQUE: Multiplanar and multiecho pulse sequences of the cervical spine, to include the craniocervical junction and cervicothoracic junction, were obtained according to standard protocol without and with intravenous contrast.  CONTRAST:  66mL MULTIHANCE GADOBENATE DIMEGLUMINE 529 MG/ML IV SOLN in conjunction with contrast enhanced imaging of the brain reported separately.  COMPARISON:  MRI brain and cervical spine 05/01/2014 and 05/07/2014, respectively  FINDINGS: Stable straightening of cervical lordosis. Cervicomedullary junction is within normal limits. No cervical spinal cord signal abnormality. Normal cord morphology. No abnormal enhancement identified.  Negative paraspinal soft tissues. Major vascular flow voids in the neck appear normal.  Minimal to mild cervical spine degenerative changes appear stable, with no cervical spinal or foraminal stenosis. As before, There is mild disc bulging at C5-C6 with a tiny central disc protrusion best seen on series 7, image 17 today.  Visualized upper thoracic spinal cord and spinal canal appear normal.  IMPRESSION: 1. Stable and normal MRI appearance of the cervical spinal cord. 2. Stable minimal cervical spine degeneration with no spinal or foraminal stenosis.   Electronically Signed   By: Genevie Ann M.D.   On: 05/25/2015 16:31     Assessment/Plan:  29 y.o. female known history of multiple  sclerosis presents to the emergency room from her ophthalmologist office due to right-sided tunnel vision. Patient had similar symptoms many years back and later diagnosed with multiple sclerosis. Follows with Dr. Manuella Ghazi of neurology. Started gelenia about 3 months ago.  Last imaging was done about 1 yr ago.  Started on solumedrol for optic neuritis.    Headache improved 6/10 diffuse pressure like.  S/p Mg, VPA 500mg  and Norco q6PRN - will give another dose of 2g Mg as well as VPA 500mg  - con't solumedrol total for 3 days - Pt's R eye vision has improved.   - out of bed and ambulate.   - S/p MRI brain and C spine, no new lesions. I would not do a dedicated MRI orbits as pt's vision is improving.    Leotis Pain  05/26/2015, 9:31 AM

## 2015-05-27 MED ORDER — ACETAMINOPHEN 325 MG PO TABS: 650.0000 mg | ORAL_TABLET | Freq: Four times a day (QID) | ORAL | Status: DC | PRN

## 2015-05-27 MED ORDER — HYDROCODONE-ACETAMINOPHEN 5-325 MG PO TABS: 1.0000 | ORAL_TABLET | Freq: Four times a day (QID) | ORAL | Status: DC | PRN

## 2015-05-27 NOTE — Discharge Instructions (Signed)
F/u with pcp 1 wk F/u with neuro dr.Shah 1 weeek Activity as tolerated as rec by PT DIET REGULAR

## 2015-05-27 NOTE — Discharge Summary (Signed)
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 Optic neuritis     rt eye  . Infection     UTI  . Fibroid   . Multiple sclerosis     HOSPITAL COURSE:   * Optic neuritis with multiple sclerosis She has been seen by ophthalmology as outpatient .  high-dose Solu-Medrol 500 mg IV 2 times a day- given  for 3 days  Appreciate neurology consult and op f/u with ophthalmology PRN ,not seen pt during hospital course Pain medications as needed  * Headache MRI of the brain and C-spine are normal Status post magnesium sulfate for 2 days , when necessary Improved with V PA, on Percocet every 6 hours Received high-dose Solu-Medrol 500 mg IV 2 times a day for 3 days Clinically improved. Tunnel vision and headaches are resolved Neurology has recommended to discharge the patient from the standpoint Evaluated by physical therapy, no need of PT as an outpatient per their recommendations  *Obesity Lifestyle changes are advised  DVT prophylaxis provided with Lovenox  DISCHARGE CONDITIONS:   Satisfactory  CONSULTS OBTAINED:  Treatment Team:  Leotis Pain, MD   PROCEDURES none  DRUG ALLERGIES:   Allergies  Allergen Reactions  . Bee Venom Swelling  . Percocet [Oxycodone-Acetaminophen] Nausea And Vomiting  . Tape Itching and Rash    DISCHARGE MEDICATIONS:   Discharge Medication List as of 05/27/2015  9:40 AM    START taking these medications   Details  acetaminophen (TYLENOL) 325 MG tablet Take 2  tablets (650 mg total) by mouth every 6 (six) hours as needed for mild pain (or Fever >/= 101)., Starting 05/27/2015, Until Discontinued, OTC    HYDROcodone-acetaminophen (NORCO/VICODIN) 5-325 MG per tablet Take 1 tablet by mouth every 6 (six) hours as needed for moderate pain (PAIN)., Starting 05/27/2015, Until Discontinued, Print      CONTINUE these medications which have NOT CHANGED   Details  Fingolimod HCl (GILENYA) 0.5 MG CAPS Take 0.5 mg by mouth daily., Until Discontinued, Historical Med    ibuprofen (ADVIL,MOTRIN) 600 MG tablet Take 1 tablet (600 mg total) by mouth every 6 (six) hours as needed., Starting 12/18/2013, Until Discontinued, Print      STOP taking these medications     HYDROmorphone (DILAUDID) 2 MG tablet          DISCHARGE INSTRUCTIONS:     DIET:  Regular diet  DISCHARGE CONDITION:  Stable  ACTIVITY:  Activity as tolerated  OXYGEN:  Home Oxygen: No.   Oxygen Delivery: room air  DISCHARGE LOCATION:  home   If you experience worsening of your admission symptoms, develop shortness of breath, life threatening emergency, suicidal or homicidal thoughts you must seek medical attention immediately by calling 911 or calling your MD immediately  if symptoms less severe.  You Must read complete instructions/literature along with all the possible adverse reactions/side effects for all the Medicines you take and that have been prescribed to you. Take any new Medicines after you have completely understood and  accpet all the possible adverse reactions/side effects.   Please note  You were cared for by a hospitalist during your hospital stay. If you have any questions about your discharge medications or the care you received while you were in the hospital after you are discharged, you can call the unit and asked to speak with the hospitalist on call if the hospitalist that took care of you is not available. Once you are discharged, your primary care physician will  handle any further medical issues. Please note that NO REFILLS for any discharge medications will be authorized once you are discharged, as it is imperative that you return to your primary care physician (or establish a relationship with a primary care physician if you do not have one) for your aftercare needs so that they can reassess your need for medications and monitor your lab values.     Today  Chief Complaint  Patient presents with  . Eye Pain    optic neuritis   Patient is resting comfortably. Headache is resolved. Tunnel vision is significantly improved. Field of vision is much better. Not seen by ophthalmology during this hospital course, recommend her to follow up with ophthalmology as an outpatient when necessary as the patient is requesting to be discharged  ROS: None CONSTITUTIONAL: Denies fevers, chills. Denies any fatigue, weakness.  EYES: Denies blurry vision, double vision, eye pain. EARS, NOSE, THROAT: Denies tinnitus, ear pain, hearing loss. RESPIRATORY: Denies cough, wheeze, shortness of breath.  CARDIOVASCULAR: Denies chest pain, palpitations, edema.  GASTROINTESTINAL: Denies nausea, vomiting, diarrhea, abdominal pain. Denies bright red blood per rectum. GENITOURINARY: Denies dysuria, hematuria. ENDOCRINE: Denies nocturia or thyroid problems. HEMATOLOGIC AND LYMPHATIC: Denies easy bruising or bleeding. SKIN: Denies rash or lesion. MUSCULOSKELETAL: Denies pain in neck, back, shoulder, knees, hips or arthritic symptoms.  NEUROLOGIC: Denies paralysis, paresthesias.  PSYCHIATRIC: Denies anxiety or depressive symptoms.   VITAL SIGNS:  Blood pressure 129/73, pulse 58, temperature 98.5 F (36.9 C), temperature source Oral, resp. rate 16, height 5\' 6"  (1.676 m), weight 107.956 kg (238 lb), last menstrual period 05/05/2015, SpO2 98 %, unknown if currently breastfeeding.  I/O:   Intake/Output Summary (Last 24 hours) at 05/27/15 1346 Last data filed at 05/27/15 1000   Gross per 24 hour  Intake    408 ml  Output      0 ml  Net    408 ml    PHYSICAL EXAMINATION:  GENERAL:  29 y.o.-year-old patient lying in the bed with no acute distress.  EYES: Pupils equal, round, reactive to light and accommodation. No scleral icterus. Extraocular muscles intact.  HEENT: Head atraumatic, normocephalic. Oropharynx and nasopharynx clear.  NECK:  Supple, no jugular venous distention. No thyroid enlargement, no tenderness.  LUNGS: Normal breath sounds bilaterally, no wheezing, rales,rhonchi or crepitation. No use of accessory muscles of respiration.  CARDIOVASCULAR: S1, S2 normal. No murmurs, rubs, or gallops.  ABDOMEN: Soft, non-tender, non-distended. Bowel sounds present. No organomegaly or mass.  EXTREMITIES: No pedal edema, cyanosis, or clubbing.  NEUROLOGIC: Cranial nerves II through XII are intact. Muscle strength 5/5 in all extremities. Sensation intact. Gait not checked.  PSYCHIATRIC: The patient is alert and oriented x 3.  SKIN: No obvious rash, lesion, or ulcer.   DATA REVIEW:   CBC  Recent Labs Lab 05/24/15 1809  WBC 5.7  HGB 12.7  HCT 38.5  PLT 277    Chemistries   Recent Labs Lab 05/24/15 1809  NA 139  K 3.8  CL 105  CO2  25  GLUCOSE 96  BUN 13  CREATININE 0.91  CALCIUM 9.1  AST 23  ALT 28  ALKPHOS 111  BILITOT 0.3    Cardiac Enzymes No results for input(s): TROPONINI in the last 168 hours.  Microbiology Results  Results for orders placed or performed during the hospital encounter of 12/04/13  Urine culture     Status: None   Collection Time: 12/04/13 12:30 PM  Result Value Ref Range Status   Specimen Description URINE, RANDOM  Final   Special Requests NONE  Final   Culture  Setup Time   Final    12/04/2013 18:28 Performed at [facility] Performed at [facility]  Final   Culture NO GROWTH Performed at [facility]  Final   Report Status 12/05/2013 FINAL  Final     RADIOLOGY:  Mr [name] Contrast  05/25/2015   CLINICAL DATA:  29 year old female with history of multiple sclerosis with new right side tunnel vision. Initial encounter.  EXAM: MRI HEAD WITHOUT AND WITH CONTRAST  TECHNIQUE: Multiplanar, multiecho pulse sequences of the brain and surrounding structures were obtained without and with intravenous contrast.  CONTRAST:  8mL MULTIHANCE GADOBENATE DIMEGLUMINE 529 MG/ML IV SOLN in conjunction with contrast enhanced imaging of the cervical spine reported separately.  COMPARISON:  Brain MRI 05/01/2014.  FINDINGS: Cerebral volume remains normal. Major intracranial vascular flow voids are stable and within normal limits. No restricted diffusion to suggest acute infarction. No midline shift, mass effect, evidence of mass lesion, ventriculomegaly, extra-axial collection or acute intracranial hemorrhage. Cervicomedullary junction and pituitary are within normal limits.  Unchanged tiny right superior frontal lobe subcortical white matter lesion, nonspecific (series 6, image 20 today). There is also a tiny nonspecific subcortical white matter FLAIR hyperintense lesion on series 6 image 17 in the left frontal lobe. On sagittal FLAIR imaging, no other white matter lesion is identified. The corpus callosum appears normal. Deep gray matter nuclei, brainstem, and cerebellum remain normal. No abnormal enhancement identified.  The optic chiasm, optic radiations, and occipital lobes appear within normal limits. No definite asymmetry of the optic nerves on these routine brain images. However, both optic nerves appear prominent on diffusion weighted imaging (series 100, image 22, compared to 2015 diffusion study image 11). The globes and other intraorbital soft tissues appear normal.  Grossly normal internal auditory structures. Visualized paranasal sinuses and mastoids are clear. Visualized scalp soft tissues are within normal limits. Normal bone marrow signal. Cervical spine  findings today are reported separately.  IMPRESSION: 1. Continued minimal cerebral white matter signal changes which are nonspecific. 2. Dedicated Orbit MRI (without and with contrast) would be more sensitive and specific with regard to the possibility of optic neuritis, but I do note new diffusion signal in both optic nerves today which may indicate optic nerve edema. 3. Otherwise normal MRI appearance of the brain.   Electronically Signed   By: Genevie Ann M.D.   On: 05/25/2015 16:39   Mr Cervical Spine W Wo Contrast  05/25/2015   CLINICAL DATA:  29 year old female with history of multiple sclerosis with new right side tunnel vision. Initial encounter.  EXAM: MRI CERVICAL SPINE WITHOUT AND WITH CONTRAST  TECHNIQUE: Multiplanar and multiecho pulse sequences of the cervical spine, to include the craniocervical junction and cervicothoracic junction, were obtained according to standard protocol without and with intravenous contrast.  CONTRAST:  64mL MULTIHANCE GADOBENATE DIMEGLUMINE 529 MG/ML IV SOLN in conjunction with contrast enhanced  imaging of the brain reported separately.  COMPARISON:  MRI brain and cervical spine 05/01/2014 and 05/07/2014, respectively  FINDINGS: Stable straightening of cervical lordosis. Cervicomedullary junction is within normal limits. No cervical spinal cord signal abnormality. Normal cord morphology. No abnormal enhancement identified.  Negative paraspinal soft tissues. Major vascular flow voids in the neck appear normal.  Minimal to mild cervical spine degenerative changes appear stable, with no cervical spinal or foraminal stenosis. As before, There is mild disc bulging at C5-C6 with a tiny central disc protrusion best seen on series 7, image 17 today.  Visualized upper thoracic spinal cord and spinal canal appear normal.  IMPRESSION: 1. Stable and normal MRI appearance of the cervical spinal cord. 2. Stable minimal cervical spine degeneration with no spinal or foraminal stenosis.    Electronically Signed   By: Genevie Ann M.D.   On: 05/25/2015 16:31    EKG:  No orders found for this or any previous visit.    Management plans discussed with the patient, family and they are in agreement.  CODE STATUS:   TOTAL TIME TAKING CARE OF THIS PATIENT: 45 minutes.    @[facility] @  on 05/27/2015 at 1:46 PM  Between 7am to 6pm - Pager - [phone]  After 6pm go to [email or web address] - password [email or web address] [facility]  Office  [phone]  CC: Primary care physician; Pcp Not In System

## 2015-05-27 NOTE — Progress Notes (Signed)
Pt ambulated in hall with physical therapy , blurred vision much improved , saline lock d/c , discharge instructions given to pt verbalizes understanding , pt discharged ambulatory care of spouse

## 2015-05-27 NOTE — Progress Notes (Signed)
PT Screen Note  Patient Details Name: Emily Rhodes MRN: [id number] DOB: [birth year]   Cancelled Treatment:    Reason Eval/Treat Not Completed: PT screened, no needs identified, will sign off. Received order for PT evaluation. Pt is an otherwise active 29 year-old female. She was admitted with optic neuritis due to [place] and has not had any complaints with mobility, balance, or strength. Pt was screened by therapist and demonstrates independent bed mobility, transfers, and ambulation without assistive device. Rhomberg negative, modified DGI 12/12. No needs. Will complete order. MD notified.   Lyndel Safe Elaynah Virginia PT, DPT   Srinika Delone 05/27/2015, 9:18 AM

## 2016-08-30 IMAGING — MR MR CERVICAL SPINE WO/W CM
9 series · 44 of 48 positions shown · IV contrast (multihance)
Comparison: MRI brain and cervical spine 05/01/2014 and 05/07/2014,
respectively

CLINICAL DATA: 28-year-old female with history of multiple
sclerosis with new right side tunnel vision. Initial encounter.

EXAM:
MRI CERVICAL SPINE WITHOUT AND WITH CONTRAST
TECHNIQUE: Multiplanar and multiecho pulse sequences of the cervical spine, to
include the craniocervical junction and cervicothoracic junction,
were obtained according to standard protocol without and with
intravenous contrast.
CONTRAST:  20mL MULTIHANCE GADOBENATE DIMEGLUMINE 529 MG/ML IV SOLN
in conjunction with contrast enhanced imaging of the brain reported
separately.

[Series 2: T2 · sagittal · 3.0mm · 0.70mm/px · 5 of 14 slices shown (1 of 2)]
[im 1/14]
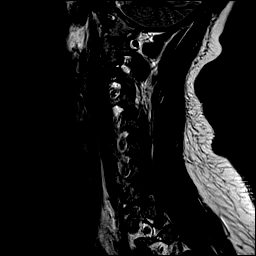
[im 4/14]
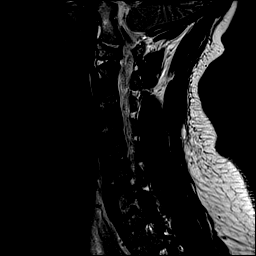
[im 7/14]
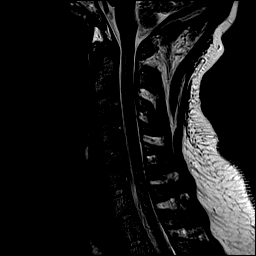
[im 10/14]
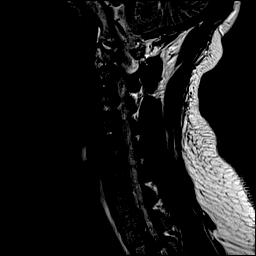
[im 14/14]
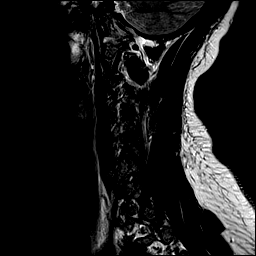

[Series 3: T1 · sagittal · 3.0mm · 0.70mm/px · 4 of 14 slices shown (1 of 2)]
[im 1/14]
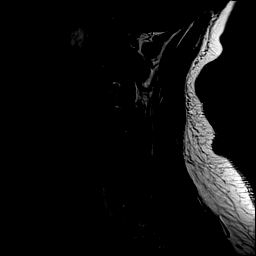
[im 5/14]
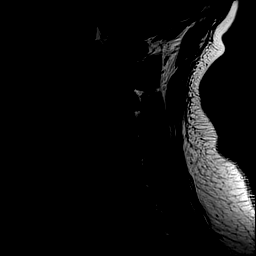
[im 9/14]
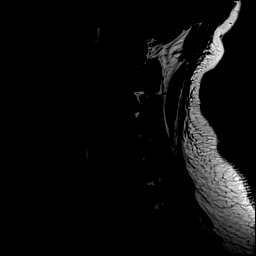
[im 14/14]
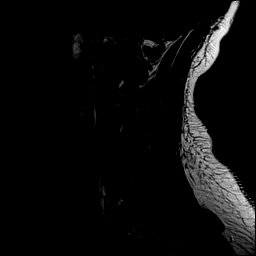

[Series 4: STIR · sagittal · 3.0mm · 0.70mm/px · 3 of 13 slices shown]
[im 1/13]
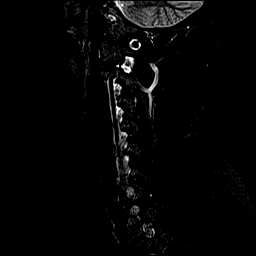
[im 7/13]
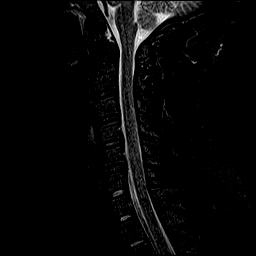
[im 13/13]
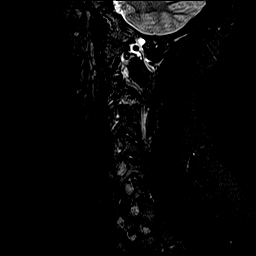

[Series 5: FLAIR · sagittal · 3.0mm · 0.35mm/px · 4 of 14 slices shown]
[im 1/14]
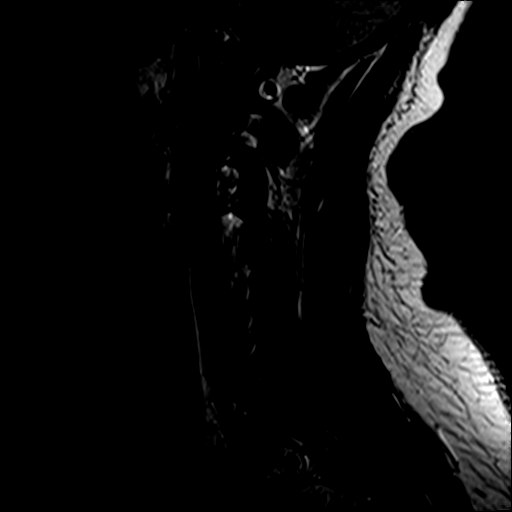
[im 5/14]
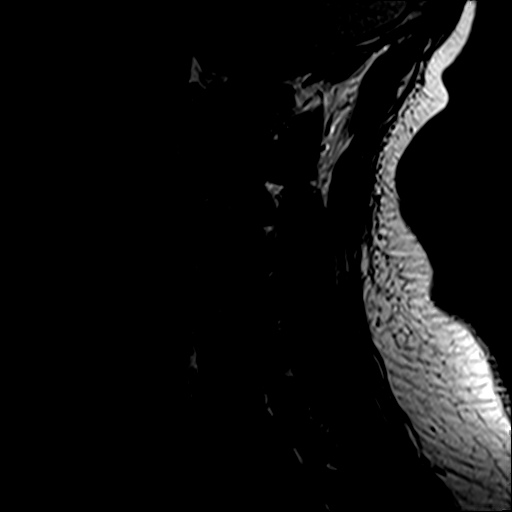
[im 9/14]
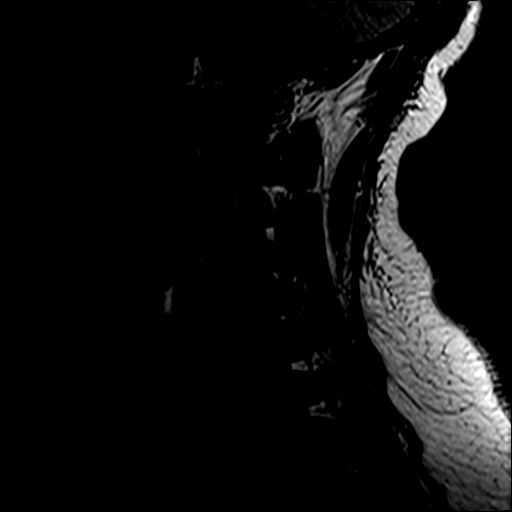
[im 14/14]
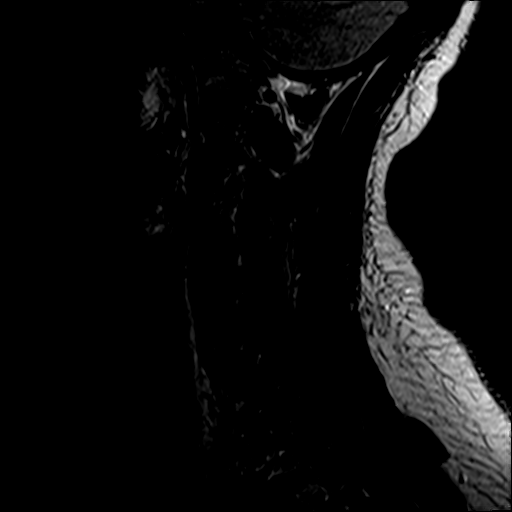

[Series 6: T2 · axial · 3.0mm · 0.70mm/px · z∈[-62,+41]mm · 7 of 28 slices shown (2 of 2)]
[im 1/28]
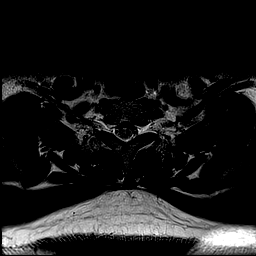
[im 5/28]
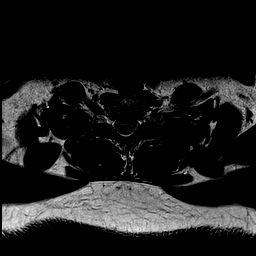
[im 10/28]
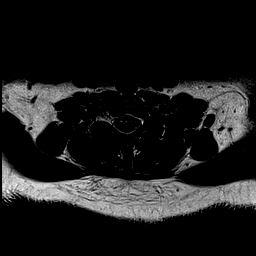
[im 14/28]
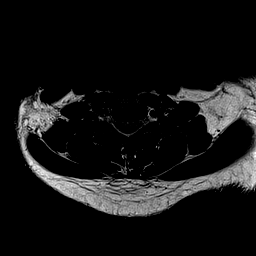
[im 19/28]
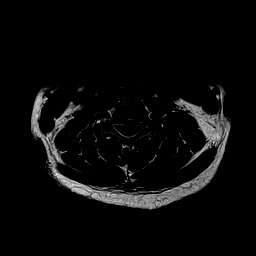
[im 23/28]
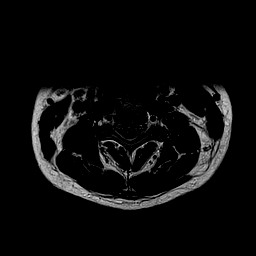
[im 28/28]
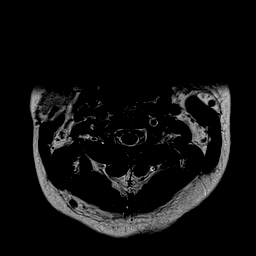

[Series 7: mpgr ax · axial · 3.0mm · 0.35mm/px · z∈[-62,-27]mm · 3 of 28 slices shown]
[im 1/28]
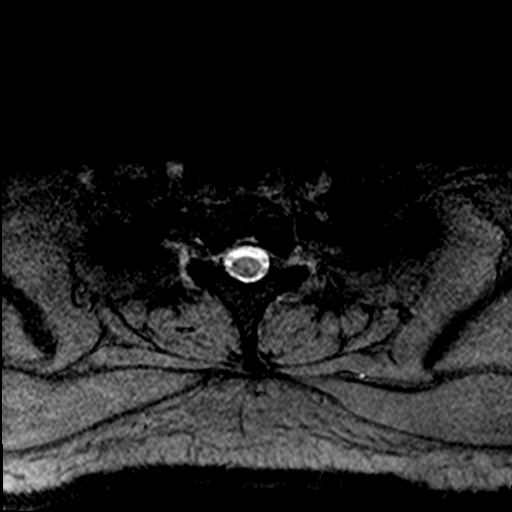
[im 5/28]
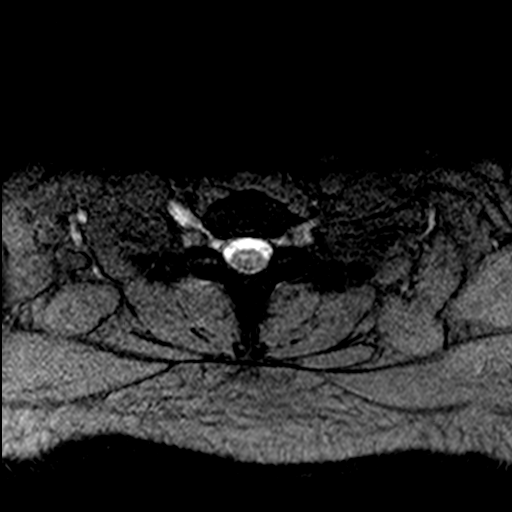
[im 10/28]
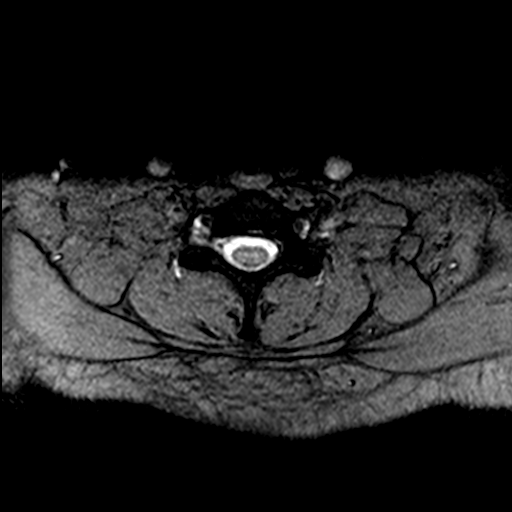

[Series 8: T1 · axial · 3.0mm · 0.70mm/px · z∈[-62,+41]mm · 7 of 28 slices shown (2 of 2)]
[im 1/28]
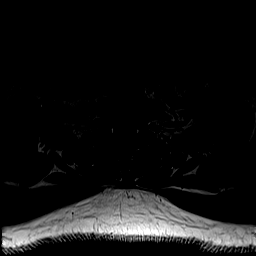
[im 5/28]
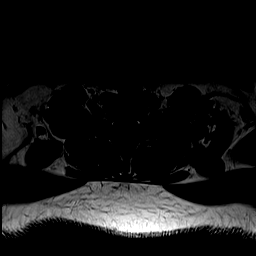
[im 10/28]
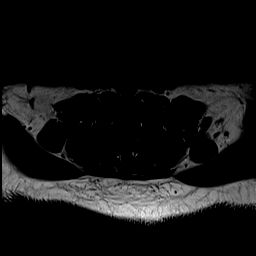
[im 14/28]
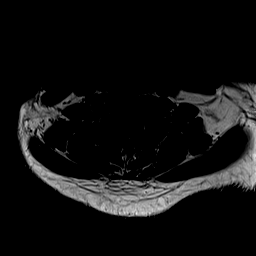
[im 19/28]
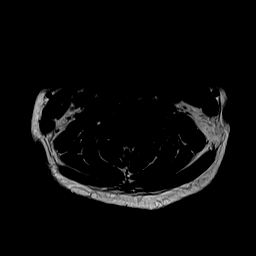
[im 23/28]
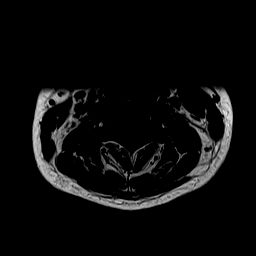
[im 28/28]
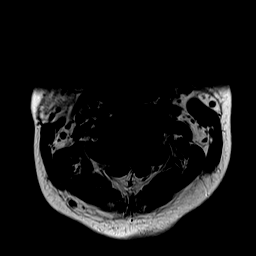

[Series 9: T1 fat-sat post-contrast · sagittal · 3.0mm · 0.70mm/px · 4 of 14 slices shown]
[im 1/14]
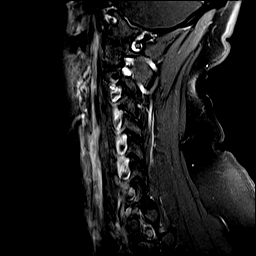
[im 5/14]
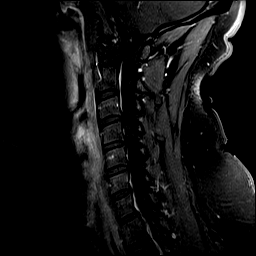
[im 9/14]
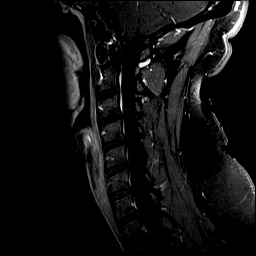
[im 14/14]
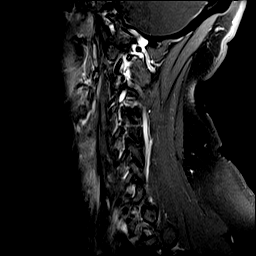

[Series 10: T1 post-contrast · axial · 3.0mm · 0.56mm/px · z∈[-62,+41]mm · 7 of 28 slices shown]
[im 1/28]
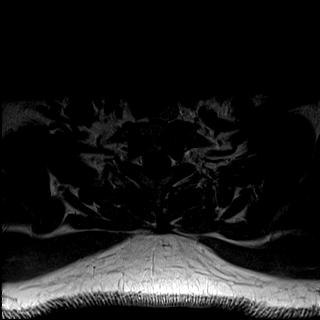
[im 5/28]
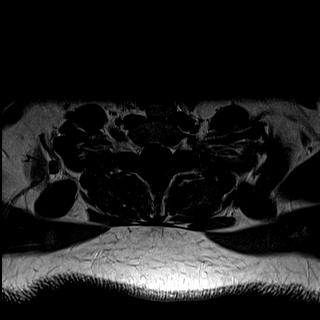
[im 10/28]
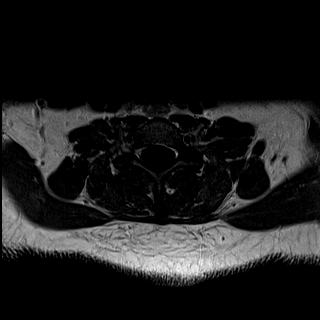
[im 14/28]
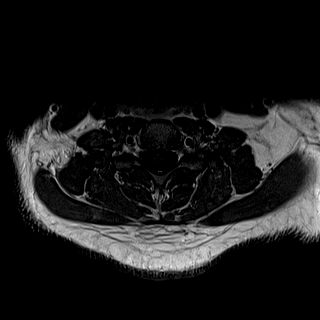
[im 19/28]
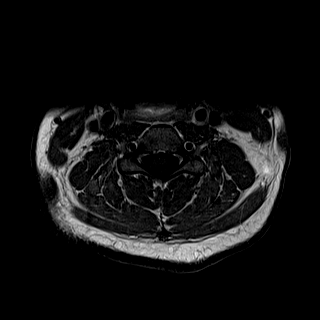
[im 23/28]
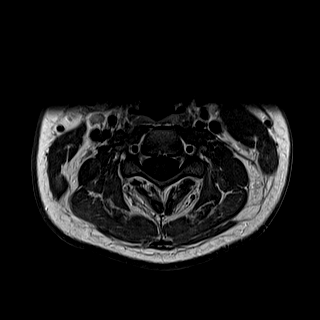
[im 28/28]
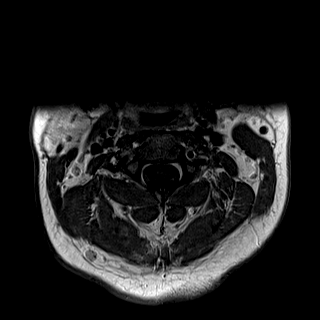

[44 of 48 positions shown; findings below may reference images not displayed]

FINDINGS: Stable straightening of cervical lordosis. Cervicomedullary junction
is within normal limits. No cervical spinal cord signal abnormality.
Normal cord morphology. No abnormal enhancement identified.

Negative paraspinal soft tissues. Major vascular flow voids in the
neck appear normal.

Minimal to mild cervical spine degenerative changes appear stable,
with no cervical spinal or foraminal stenosis. As before, There is
mild disc bulging at C5-C6 with a tiny central disc protrusion best
seen on series 7, image 17 today.

Visualized upper thoracic spinal cord and spinal canal appear
normal.
IMPRESSION: 1. Stable and normal MRI appearance of the cervical spinal cord.
2. Stable minimal cervical spine degeneration with no spinal or
foraminal stenosis.

## 2017-03-23 ENCOUNTER — Encounter (HOSPITAL_COMMUNITY): Payer: Self-pay | Admitting: *Deleted

## 2017-03-23 ENCOUNTER — Inpatient Hospital Stay (HOSPITAL_COMMUNITY)
Admission: AD | Admit: 2017-03-23 | Discharge: 2017-03-23 | Disposition: A | Discharge status: Home / Self Care | Payer: PRIVATE HEALTH INSURANCE | Source: Ambulatory Visit | Attending: Obstetrics and Gynecology | Admitting: Obstetrics and Gynecology

## 2017-03-23 DIAGNOSIS — Z885 Allergy status to narcotic agent status: Secondary | ICD-10-CM | POA: Insufficient documentation

## 2017-03-23 DIAGNOSIS — O26891 Other specified pregnancy related conditions, first trimester: Secondary | ICD-10-CM | POA: Diagnosis not present

## 2017-03-23 DIAGNOSIS — O219 Vomiting of pregnancy, unspecified: Secondary | ICD-10-CM

## 2017-03-23 DIAGNOSIS — R12 Heartburn: Secondary | ICD-10-CM | POA: Insufficient documentation

## 2017-03-23 DIAGNOSIS — Z3A09 9 weeks gestation of pregnancy: Secondary | ICD-10-CM | POA: Insufficient documentation

## 2017-03-23 DIAGNOSIS — O210 Mild hyperemesis gravidarum: Secondary | ICD-10-CM | POA: Insufficient documentation

## 2017-03-23 LAB — CBC
HEMATOCRIT: 38.3 % (ref 36.0–46.0)
Hemoglobin: 12.9 g/dL (ref 12.0–15.0)
MCH: 26.8 pg (ref 26.0–34.0)
MCHC: 33.7 g/dL (ref 30.0–36.0)
MCV: 79.6 fL (ref 78.0–100.0)
Platelets: 282 10*3/uL (ref 150–400)
RBC: 4.81 MIL/uL (ref 3.87–5.11)
RDW: 13.4 % (ref 11.5–15.5)
WBC: 12 10*3/uL — AB (ref 4.0–10.5)

## 2017-03-23 LAB — URINALYSIS, ROUTINE W REFLEX MICROSCOPIC
Bilirubin Urine: NEGATIVE
Glucose, UA: NEGATIVE mg/dL
Hgb urine dipstick: NEGATIVE
Ketones, ur: 20 mg/dL — AB
Nitrite: NEGATIVE
PROTEIN: 30 mg/dL — AB
SPECIFIC GRAVITY, URINE: 1.034 — AB (ref 1.005–1.030)
pH: 5 (ref 5.0–8.0)

## 2017-03-23 LAB — COMPREHENSIVE METABOLIC PANEL
ALT: 22 U/L (ref 14–54)
AST: 22 U/L (ref 15–41)
Albumin: 4.2 g/dL (ref 3.5–5.0)
Alkaline Phosphatase: 75 U/L (ref 38–126)
Anion gap: 10 (ref 5–15)
BILIRUBIN TOTAL: 0.7 mg/dL (ref 0.3–1.2)
BUN: 15 mg/dL (ref 6–20)
CO2: 22 mmol/L (ref 22–32)
Calcium: 9.4 mg/dL (ref 8.9–10.3)
Chloride: 105 mmol/L (ref 101–111)
Creatinine, Ser: 0.75 mg/dL (ref 0.44–1.00)
Glucose, Bld: 88 mg/dL (ref 65–99)
POTASSIUM: 3.6 mmol/L (ref 3.5–5.1)
Sodium: 137 mmol/L (ref 135–145)
TOTAL PROTEIN: 7.7 g/dL (ref 6.5–8.1)

## 2017-03-23 LAB — POCT PREGNANCY, URINE: PREG TEST UR: POSITIVE — AB

## 2017-03-23 MED ORDER — FAMOTIDINE 20 MG PO TABS: 20.0000 mg | ORAL_TABLET | Freq: Two times a day (BID) | ORAL | 0 refills | Status: DC

## 2017-03-23 MED ORDER — FAMOTIDINE IN NACL 20-0.9 MG/50ML-% IV SOLN
20.0000 mg | Freq: Once | INTRAVENOUS | Status: AC
  Administered 2017-03-23: 20 mg via INTRAVENOUS
  Filled 2017-03-23: qty 50

## 2017-03-23 MED ORDER — PROMETHAZINE HCL 25 MG PO TABS: 25.0000 mg | ORAL_TABLET | Freq: Four times a day (QID) | ORAL | 0 refills | Status: DC | PRN

## 2017-03-23 MED ORDER — PROMETHAZINE HCL 25 MG/ML IJ SOLN
25.0000 mg | Freq: Once | INTRAVENOUS | Status: AC
  Administered 2017-03-23: 25 mg via INTRAVENOUS
  Filled 2017-03-23: qty 1

## 2017-03-23 NOTE — Discharge Instructions (Signed)
Eating Plan for Hyperemesis Gravidarum Hyperemesis gravidarum is a severe form of morning sickness. Because this condition causes severe nausea and vomiting, it can lead to dehydration, malnutrition, and weight loss. One way to lessen the symptoms of nausea and vomiting is to follow the eating plan for hyperemesis gravidarum. It is often used along with prescribed medicines to control your symptoms. What can I do to relieve my symptoms? Listen to your body. Everyone is different and has different preferences. Find what works best for you. Take any of the following actions that are helpful to you:  Eat and drink slowly.  Eat 5-6 small meals daily instead of 3 large meals.  Eat crackers before you get out of bed in the morning.  Try having a snack in the middle of the night.  Starchy foods are usually tolerated well. Examples include cereal, toast, bread, potatoes, pasta, rice, and pretzels.  Ginger may help with nausea. Add  tsp ground ginger to hot tea or choose ginger tea.  Try drinking 100% fruit juice or an electrolyte drink. An electrolyte drink contains sodium, potassium, and chloride.  Continue to take your prenatal vitamins as told by your health care provider. If you are having trouble taking your prenatal vitamins, talk with your health care provider about different options.  Include at least 1 serving of protein with your meals and snacks. Protein options include meats or poultry, beans, nuts, eggs, and yogurt. Try eating a protein-rich snack before bed. Examples of these snacks include cheese and crackers or half of a peanut butter or [place] sandwich.  Consider eliminating foods that trigger your symptoms. These may include spicy foods, coffee, high-fat foods, very sweet foods, and acidic foods.  Try meals that have more protein combined with bland, salty, lower-fat, and dry foods, such as nuts, seeds, pretzels, crackers, and cereal.  Talk with your healthcare provider about  starting a supplement of vitamin B6.  Have fluids that are cold, clear, and carbonated or sour. Examples include lemonade, ginger ale, lemon-lime soda, ice water, and sparkling water.  Try lemon or mint tea.  Try brushing your teeth or using a mouth rinse after meals. What should I avoid to reduce my symptoms? Avoiding some of the following things may help reduce your symptoms.  Foods with strong smells. Try eating meals in well-ventilated areas that are free of odors.  Drinking water or other beverages with meals. Try not to drink anything during the 30 minutes before and after your meals.  Drinking more than 1 cup of fluid at a time. Sometimes using a straw helps.  Fried or high-fat foods, such as butter and cream sauces.  Spicy foods.  Skipping meals as best as you can. Nausea can be more intense on an empty stomach. If you cannot tolerate food at that time, do not force it. Try sucking on ice chips or other frozen items, and make up for missed calories later.  Lying down within 2 hours after eating.  Environmental triggers. These may include smoky rooms, closed spaces, rooms with strong smells, warm or humid places, overly loud and noisy rooms, and rooms with motion or flickering lights.  Quick and sudden changes in your movement. This information is not intended to replace advice given to you by your health care provider. Make sure you discuss any questions you have with your health care provider. Document Released: 08/23/2007 Document Revised: 06/24/2016 Document Reviewed: 05/26/2016 Elsevier Interactive Patient Education  2017 [employer].

## 2017-03-23 NOTE — MAU Provider Note (Signed)
History     CSN: [id number]  Arrival date and time: 03/23/17 1156   First Provider Initiated Contact with Patient 03/23/17 1444      Chief Complaint  Patient presents with  . Morning Sickness   HPI Ms. Emily Rhodes is a 31 y.o. G2P1001 at [redacted]w[redacted]d who presents to MAU today with complaint of N/V today. She states that this has been an issues throughout the pregnancy. 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 She has had some heartburn and is taking Zantac. She denies lower abdominal pain, discharge, vaginal bleeding, diarrhea or fever.    OB History    Gravida Para Term Preterm AB Living   2 1 1  0 0 1   SAB TAB Ectopic Multiple Live Births   0 0 0 0 1      Past Medical History:  Diagnosis Date  . Fibroid   . Infection    UTI  . Multiple sclerosis ([place])   . Optic neuritis    rt eye    Past Surgical History:  Procedure Laterality Date  . spinal tap    . WISDOM TOOTH EXTRACTION     age 63    Family History  Problem Relation Age of Onset  . Diabetes Father   . Hypertension Father   . Hearing loss Neg Hx     Social History  Substance Use Topics  . Smoking status: Never Smoker  . Smokeless tobacco: Never Used  . Alcohol use No    Allergies:  Allergies  Allergen Reactions  . Bee Venom Swelling  . Percocet [Oxycodone-Acetaminophen] Nausea And Vomiting  . Tape Itching and Rash    No prescriptions prior to admission.    Review of Systems  Constitutional: Negative for fever.  Gastrointestinal: Positive for abdominal pain, nausea and vomiting. Negative for constipation and diarrhea.  Genitourinary: Negative for dysuria, frequency, urgency, vaginal bleeding and vaginal discharge.   Physical Exam   Blood pressure 116/68, pulse 89, temperature 98.2 F (36.8 C), resp. rate 20, height 5\' 5"  (1.651 m), weight 235 lb (106.6 kg), last menstrual period 01/11/2017, unknown if currently breastfeeding.  Physical Exam  Nursing note and vitals  reviewed. Constitutional: She is oriented to person, place, and time. She appears well-developed and well-nourished. No distress.  HENT:  Head: Normocephalic and atraumatic.  Cardiovascular: Normal rate.   Respiratory: Effort normal.  GI: Soft. She exhibits no distension and no mass. There is no tenderness. There is no rebound and no guarding.  Neurological: She is alert and oriented to person, place, and time.  Skin: Skin is warm and dry. No erythema.  Psychiatric: She has a normal mood and affect.    Results for orders placed or performed during the hospital encounter of 03/23/17 (from the past 24 hour(s))  Urinalysis, Routine w reflex microscopic     Status: Abnormal   Collection Time: 03/23/17 12:15 PM  Result Value Ref Range   Color, Urine YELLOW YELLOW   APPearance HAZY (A) CLEAR   Specific Gravity, Urine 1.034 (H) 1.005 - 1.030   pH 5.0 5.0 - 8.0   Glucose, UA NEGATIVE NEGATIVE mg/dL   Hgb urine dipstick NEGATIVE NEGATIVE   Bilirubin Urine NEGATIVE NEGATIVE   Ketones, ur 20 (A) NEGATIVE mg/dL   Protein, ur 30 (A) NEGATIVE mg/dL   Nitrite NEGATIVE NEGATIVE   Leukocytes, UA TRACE (A) NEGATIVE   RBC / HPF 6-30 0 - 5 RBC/hpf   WBC, UA 6-30 0 - 5 WBC/hpf  Bacteria, UA RARE (A) NONE SEEN   Squamous Epithelial / LPF 0-5 (A) NONE SEEN   Mucous PRESENT    Ca Oxalate Crys, UA PRESENT   Pregnancy, urine POC     Status: Abnormal   Collection Time: 03/23/17 12:38 PM  Result Value Ref Range   Preg Test, Ur POSITIVE (A) NEGATIVE  CBC     Status: Abnormal   Collection Time: 03/23/17  3:05 PM  Result Value Ref Range   WBC 12.0 (H) 4.0 - 10.5 K/uL   RBC 4.81 3.87 - 5.11 MIL/uL   Hemoglobin 12.9 12.0 - 15.0 g/dL   HCT 38.3 36.0 - 46.0 %   MCV 79.6 78.0 - 100.0 fL   MCH 26.8 26.0 - 34.0 pg   MCHC 33.7 30.0 - 36.0 g/dL   RDW 13.4 11.5 - 15.5 %   Platelets 282 150 - 400 K/uL  Comprehensive metabolic panel     Status: None   Collection Time: 03/23/17  3:05 PM  Result Value Ref  Range   Sodium 137 135 - 145 mmol/L   Potassium 3.6 3.5 - 5.1 mmol/L   Chloride 105 101 - 111 mmol/L   CO2 22 22 - 32 mmol/L   Glucose, Bld 88 65 - 99 mg/dL   BUN 15 6 - 20 mg/dL   Creatinine, Ser 0.75 0.44 - 1.00 mg/dL   Calcium 9.4 8.9 - 10.3 mg/dL   Total Protein 7.7 6.5 - 8.1 g/dL   Albumin 4.2 3.5 - 5.0 g/dL   AST 22 15 - 41 U/L   ALT 22 14 - 54 U/L   Alkaline Phosphatase 75 38 - 126 U/L   Total Bilirubin 0.7 0.3 - 1.2 mg/dL   GFR calc non Af Amer >60 >60 mL/min   GFR calc Af Amer >60 >60 mL/min   Anion gap 10 5 - 15    MAU Course  Procedures None  MDM +UPT UA today with mild dehydration  IV LR with phenergan and Pepcid Patient is no longer having emesis, but still reports some nausea 2nd liter IV LR given  Patient is resting comfortably prior to discharge.  Once she got up to leave she had another episode of vomiting. She is comfortable trying her Rx anti-emetics.   Assessment and Plan  A: SIUP at [redacted]w[redacted]d Nausea and vomiting in pregnancy, first trimester Heartburn   P: Discharge home Rx for Phenergan and Pepcid given to patient  Diet for N/V in pregnancy discussed Patient advised to follow-up with [facility] as scheduled for routine prenatal care Patient may return to MAU as needed or if her condition were to change or worsen   Kerry Hough, PA-C 03/23/2017, 6:47 PM

## 2017-03-23 NOTE — MAU Note (Signed)
Pt presents to MAU with complaints of nausea and vomiting and lower abdominal cramping since Saturday. Pt has been evaluated by Dr Melba Coon and given diclegis and bonjesta for nausea. Denies any vaginal bleeding or abnormal discharge

## 2017-03-24 LAB — OB RESULTS CONSOLE RUBELLA ANTIBODY, IGM: Rubella: IMMUNE

## 2017-03-24 LAB — OB RESULTS CONSOLE ABO/RH: RH Type: POSITIVE

## 2017-03-24 LAB — OB RESULTS CONSOLE GC/CHLAMYDIA
CHLAMYDIA, DNA PROBE: NEGATIVE
GC PROBE AMP, GENITAL: NEGATIVE

## 2017-03-24 LAB — OB RESULTS CONSOLE ANTIBODY SCREEN: ANTIBODY SCREEN: NEGATIVE

## 2017-03-24 LAB — OB RESULTS CONSOLE HIV ANTIBODY (ROUTINE TESTING): HIV: NONREACTIVE

## 2017-03-24 LAB — OB RESULTS CONSOLE HEPATITIS B SURFACE ANTIGEN: Hepatitis B Surface Ag: NEGATIVE

## 2017-03-24 LAB — OB RESULTS CONSOLE RPR: RPR: NONREACTIVE

## 2017-07-27 ENCOUNTER — Inpatient Hospital Stay (HOSPITAL_COMMUNITY)
Admission: AD | Admit: 2017-07-27 | Discharge: 2017-07-28 | Disposition: A | Discharge status: Home / Self Care | Payer: No Typology Code available for payment source | Source: Ambulatory Visit | Attending: Obstetrics and Gynecology | Admitting: Obstetrics and Gynecology

## 2017-07-27 ENCOUNTER — Encounter (HOSPITAL_COMMUNITY): Payer: Self-pay | Admitting: *Deleted

## 2017-07-27 DIAGNOSIS — Z91030 Bee allergy status: Secondary | ICD-10-CM | POA: Insufficient documentation

## 2017-07-27 DIAGNOSIS — Z833 Family history of diabetes mellitus: Secondary | ICD-10-CM | POA: Insufficient documentation

## 2017-07-27 DIAGNOSIS — O26892 Other specified pregnancy related conditions, second trimester: Secondary | ICD-10-CM | POA: Insufficient documentation

## 2017-07-27 DIAGNOSIS — O9A212 Injury, poisoning and certain other consequences of external causes complicating pregnancy, second trimester: Secondary | ICD-10-CM

## 2017-07-27 DIAGNOSIS — Z8249 Family history of ischemic heart disease and other diseases of the circulatory system: Secondary | ICD-10-CM | POA: Insufficient documentation

## 2017-07-27 DIAGNOSIS — G35 Multiple sclerosis: Secondary | ICD-10-CM | POA: Insufficient documentation

## 2017-07-27 DIAGNOSIS — Z3A27 27 weeks gestation of pregnancy: Secondary | ICD-10-CM | POA: Insufficient documentation

## 2017-07-27 DIAGNOSIS — Z885 Allergy status to narcotic agent status: Secondary | ICD-10-CM | POA: Insufficient documentation

## 2017-07-27 DIAGNOSIS — Z79899 Other long term (current) drug therapy: Secondary | ICD-10-CM | POA: Insufficient documentation

## 2017-07-27 DIAGNOSIS — Z91048 Other nonmedicinal substance allergy status: Secondary | ICD-10-CM | POA: Insufficient documentation

## 2017-07-27 DIAGNOSIS — M549 Dorsalgia, unspecified: Secondary | ICD-10-CM | POA: Insufficient documentation

## 2017-07-27 DIAGNOSIS — O99352 Diseases of the nervous system complicating pregnancy, second trimester: Secondary | ICD-10-CM | POA: Insufficient documentation

## 2017-07-27 DIAGNOSIS — H469 Unspecified optic neuritis: Secondary | ICD-10-CM | POA: Insufficient documentation
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
## 2017-07-28 DIAGNOSIS — O26892 Other specified pregnancy related conditions, second trimester: Secondary | ICD-10-CM | POA: Diagnosis present

## 2017-07-28 DIAGNOSIS — Z885 Allergy status to narcotic agent status: Secondary | ICD-10-CM | POA: Diagnosis not present

## 2017-07-28 DIAGNOSIS — Z3A27 27 weeks gestation of pregnancy: Secondary | ICD-10-CM

## 2017-07-28 DIAGNOSIS — Z79899 Other long term (current) drug therapy: Secondary | ICD-10-CM | POA: Diagnosis not present

## 2017-07-28 DIAGNOSIS — Z91030 Bee allergy status: Secondary | ICD-10-CM | POA: Diagnosis not present

## 2017-07-28 DIAGNOSIS — Z91048 Other nonmedicinal substance allergy status: Secondary | ICD-10-CM | POA: Diagnosis not present

## 2017-07-28 DIAGNOSIS — Z8249 Family history of ischemic heart disease and other diseases of the circulatory system: Secondary | ICD-10-CM | POA: Diagnosis not present

## 2017-07-28 DIAGNOSIS — Z833 Family history of diabetes mellitus: Secondary | ICD-10-CM | POA: Diagnosis not present

## 2017-07-28 DIAGNOSIS — H469 Unspecified optic neuritis: Secondary | ICD-10-CM | POA: Diagnosis not present

## 2017-07-28 DIAGNOSIS — M549 Dorsalgia, unspecified: Secondary | ICD-10-CM | POA: Diagnosis not present

## 2017-07-28 DIAGNOSIS — S3991XA Unspecified injury of abdomen, initial encounter: Secondary | ICD-10-CM

## 2017-07-28 DIAGNOSIS — O99352 Diseases of the nervous system complicating pregnancy, second trimester: Secondary | ICD-10-CM | POA: Diagnosis not present

## 2017-07-28 DIAGNOSIS — O9A212 Injury, poisoning and certain other consequences of external causes complicating pregnancy, second trimester: Secondary | ICD-10-CM

## 2017-07-28 DIAGNOSIS — G35 Multiple sclerosis: Secondary | ICD-10-CM | POA: Diagnosis not present
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
## 2017-09-24 LAB — OB RESULTS CONSOLE GBS: STREP GROUP B AG: NEGATIVE

## 2017-09-27 ENCOUNTER — Inpatient Hospital Stay (HOSPITAL_COMMUNITY)
Admission: AD | Admit: 2017-09-27 | Discharge: 2017-09-27 | Disposition: A | Discharge status: Home / Self Care | Payer: PRIVATE HEALTH INSURANCE | Source: Ambulatory Visit | Attending: Obstetrics and Gynecology | Admitting: Obstetrics and Gynecology

## 2017-09-27 ENCOUNTER — Encounter (HOSPITAL_COMMUNITY): Payer: Self-pay | Admitting: *Deleted

## 2017-09-27 DIAGNOSIS — Z3490 Encounter for supervision of normal pregnancy, unspecified, unspecified trimester: Secondary | ICD-10-CM | POA: Diagnosis present

## 2017-09-27 DIAGNOSIS — Z3A00 Weeks of gestation of pregnancy not specified: Secondary | ICD-10-CM | POA: Diagnosis not present

## 2017-09-27 DIAGNOSIS — O34219 Maternal care for unspecified type scar from previous cesarean delivery: Secondary | ICD-10-CM

## 2017-09-27 NOTE — MAU Note (Signed)
Pt reports contractions every 5 mins that started last night-worse today. Pt reports loosing mucous plugf yesterday and having some bloody show today. Reports good fetal movement. States cervix was 2cm on Friday. Pt is scheduled for primary c/s on 10/19/2017 per neurologist request for optic neuritis.

## 2017-09-27 NOTE — MAU Note (Signed)
I have communicated with  Dr. Melba Coon and reviewed vital signs:  Vitals:   09/27/17 0050 09/27/17 0234  BP: 110/71 110/73  Pulse: 85 89  Resp:  16  Temp:  98.2 F (36.8 C)  SpO2:      Vaginal exam:  Dilation: 3 Effacement (%): 40 Cervical Position: Middle Station: -3 Presentation: Vertex Exam by:: K. WeissRN,   Also reviewed contraction pattern and that non-stress test is reactive.  It has been documented that patient is contracting every 2-10  minutes with zero cervical change over 1 1/2  hours not indicating active labor.  Patient denies any other complaints.  Based on this report provider has given order for discharge.  A discharge order and diagnosis entered by a provider.   Labor discharge instructions reviewed with patient.

## 2017-09-27 NOTE — Discharge Instructions (Signed)
Cesarean Delivery Cesarean birth, or cesarean delivery, is the surgical delivery of a baby through an incision in the abdomen and the uterus. This may be referred to as a C-section. This procedure may be scheduled ahead of time, or it may be done in an emergency situation. Tell a health care provider about:  Any allergies you have.  All medicines you are taking, including vitamins, herbs, eye drops, creams, and over-the-counter medicines.  Any problems you or family members have had with anesthetic medicines.  Any blood disorders you have.  Any surgeries you have had.  Any medical conditions you have.  Whether you or any members of your family have a history of deep vein thrombosis (DVT) or pulmonary embolism (PE). What are the risks? Generally, this is a safe procedure. However, problems may occur, including:  Infection.  Bleeding.  Allergic reactions to medicines.  Damage to other structures or organs.  Blood clots.  Injury to your baby.  What happens before the procedure?  Follow instructions from your health care provider about eating or drinking restrictions.  Follow instructions from your health care provider about bathing before your procedure to help reduce your risk of infection.  If you know that you are going to have a cesarean delivery, do not shave your pubic area. Shaving before the procedure may increase your risk of infection.  Ask your health care provider about: ? Changing or stopping your regular medicines. This is especially important if you are taking diabetes medicines or blood thinners. ? Your pain management plan. This is especially important if you plan to breastfeed your baby. ? How long you will be in the hospital after the procedure. ? Any concerns you may have about receiving blood products if you need them during the procedure. ? Cord blood banking, if you plan to collect your babys umbilical cord blood.  You may also want to ask your  health care provider: ? Whether you will be able to hold or breastfeed your baby while you are still in the operating room. ? Whether your baby can stay with you immediately after the procedure and during your recovery. ? Whether a family member or a person of your choice can go with you into the operating room and stay with you during the procedure, immediately after the procedure, and during your recovery.  Plan to have someone drive you home when you are discharged from the hospital. What happens during the procedure?  Fetal monitors will be placed on your abdomen to monitor your heart rate and your baby's heart rate.  Depending on the reason for your cesarean delivery, you may have a physical exam or additional testing, such as an ultrasound.  An IV tube will be inserted into one of your veins.  You may have your blood or urine tested.  You will be given antibiotic medicine to help prevent infection.  You may be given a special warming gown to wear to keep your temperature stable.  Hair may be removed from your pubic area.  The skin of your pubic area and lower abdomen will be cleaned with a germ-killing solution (antiseptic).  A catheter may be inserted into your bladder through your urethra. This drains your urine during the procedure.  You may be given one or more of the following: ? A medicine to numb the area (local anesthetic). ? A medicine to make you fall asleep (general anesthetic). ? A medicine (regional anesthetic) that is injected into your back or through a small  thin tube placed in your back (spinal anesthetic or epidural anesthetic). This numbs everything below the injection site and allows you to stay awake during your procedure. If this makes you feel nauseous, tell your health care provider. Medicines will be available to help reduce any nausea you may feel.  An incision will be made in your abdomen, and then in your uterus.  If you are awake during your  procedure, you may feel tugging and pulling in your abdomen, but you should not feel pain. If you feel pain, tell your health care provider immediately.  Your baby will be removed from your uterus. You may feel more pressure or pushing while this happens.  Immediately after birth, your baby will be dried and kept warm. You may be able to hold and breastfeed your baby. The umbilical cord may be clamped and cut during this time.  Your placenta will be removed from your uterus.  Your incisions will be closed with stitches (sutures). Staples, skin glue, or adhesive strips may also be applied to the incision in your abdomen.  Bandages (dressings) will be placed over the incision in your abdomen. The procedure may vary among health care providers and hospitals. What happens after the procedure?  Your blood pressure, heart rate, breathing rate, and blood oxygen level will be monitored often until the medicines you were given have worn off.  You may continue to receive fluids and medicines through an IV tube.  You will have some pain. Medicines will be available to help control your pain.  To help prevent blood clots: ? You may be given medicines. ? You may have to wear compression stockings or devices. ? You will be encouraged to walk around when you are able.  Hospital staff will encourage and support bonding with your baby. Your hospital may allow you and your baby to stay in the same room (rooming in) during your hospital stay to encourage successful breastfeeding.  You may be encouraged to cough and breathe deeply often. This helps to prevent lung problems.  If you have a catheter draining your urine, it will be removed as soon as possible after your procedure. This information is not intended to replace advice given to you by your health care provider. Make sure you discuss any questions you have with your health care provider. Document Released: 10/26/2005 Document Revised: 04/02/2016  Document Reviewed: 08/06/2015 Elsevier Interactive Patient Education  2017 [employer].   Cesarean Delivery Cesarean birth, or cesarean delivery, is the surgical delivery of a baby through an incision in the abdomen and the uterus. This may be referred to as a C-section. This procedure may be scheduled ahead of time, or it may be done in an emergency situation. Tell a health care provider about:  Any allergies you have.  All medicines you are taking, including vitamins, herbs, eye drops, creams, and over-the-counter medicines.  Any problems you or family members have had with anesthetic medicines.  Any blood disorders you have.  Any surgeries you have had.  Any medical conditions you have.  Whether you or any members of your family have a history of deep vein thrombosis (DVT) or pulmonary embolism (PE). What are the risks? Generally, this is a safe procedure. However, problems may occur, including:  Infection.  Bleeding.  Allergic reactions to medicines.  Damage to other structures or organs.  Blood clots.  Injury to your baby.  What happens before the procedure?  Follow instructions from your health care provider about eating or  drinking restrictions.  Follow instructions from your health care provider about bathing before your procedure to help reduce your risk of infection.  If you know that you are going to have a cesarean delivery, do not shave your pubic area. Shaving before the procedure may increase your risk of infection.  Ask your health care provider about: ? Changing or stopping your regular medicines. This is especially important if you are taking diabetes medicines or blood thinners. ? Your pain management plan. This is especially important if you plan to breastfeed your baby. ? How long you will be in the hospital after the procedure. ? Any concerns you may have about receiving blood products if you need them during the procedure. ? Cord blood  banking, if you plan to collect your babys umbilical cord blood.  You may also want to ask your health care provider: ? Whether you will be able to hold or breastfeed your baby while you are still in the operating room. ? Whether your baby can stay with you immediately after the procedure and during your recovery. ? Whether a family member or a person of your choice can go with you into the operating room and stay with you during the procedure, immediately after the procedure, and during your recovery.  Plan to have someone drive you home when you are discharged from the hospital. What happens during the procedure?  Fetal monitors will be placed on your abdomen to monitor your heart rate and your baby's heart rate.  Depending on the reason for your cesarean delivery, you may have a physical exam or additional testing, such as an ultrasound.  An IV tube will be inserted into one of your veins.  You may have your blood or urine tested.  You will be given antibiotic medicine to help prevent infection.  You may be given a special warming gown to wear to keep your temperature stable.  Hair may be removed from your pubic area.  The skin of your pubic area and lower abdomen will be cleaned with a germ-killing solution (antiseptic).  A catheter may be inserted into your bladder through your urethra. This drains your urine during the procedure.  You may be given one or more of the following: ? A medicine to numb the area (local anesthetic). ? A medicine to make you fall asleep (general anesthetic). ? A medicine (regional anesthetic) that is injected into your back or through a small thin tube placed in your back (spinal anesthetic or epidural anesthetic). This numbs everything below the injection site and allows you to stay awake during your procedure. If this makes you feel nauseous, tell your health care provider. Medicines will be available to help reduce any nausea you may feel.  An  incision will be made in your abdomen, and then in your uterus.  If you are awake during your procedure, you may feel tugging and pulling in your abdomen, but you should not feel pain. If you feel pain, tell your health care provider immediately.  Your baby will be removed from your uterus. You may feel more pressure or pushing while this happens.  Immediately after birth, your baby will be dried and kept warm. You may be able to hold and breastfeed your baby. The umbilical cord may be clamped and cut during this time.  Your placenta will be removed from your uterus.  Your incisions will be closed with stitches (sutures). Staples, skin glue, or adhesive strips may also be applied to the incision  in your abdomen.  Bandages (dressings) will be placed over the incision in your abdomen. The procedure may vary among health care providers and hospitals. What happens after the procedure?  Your blood pressure, heart rate, breathing rate, and blood oxygen level will be monitored often until the medicines you were given have worn off.  You may continue to receive fluids and medicines through an IV tube.  You will have some pain. Medicines will be available to help control your pain.  To help prevent blood clots: ? You may be given medicines. ? You may have to wear compression stockings or devices. ? You will be encouraged to walk around when you are able.  Hospital staff will encourage and support bonding with your baby. Your hospital may allow you and your baby to stay in the same room (rooming in) during your hospital stay to encourage successful breastfeeding.  You may be encouraged to cough and breathe deeply often. This helps to prevent lung problems.  If you have a catheter draining your urine, it will be removed as soon as possible after your procedure. This information is not intended to replace advice given to you by your health care provider. Make sure you discuss any questions you  have with your health care provider. Document Released: 10/26/2005 Document Revised: 04/02/2016 Document Reviewed: 08/06/2015 Elsevier Interactive Patient Education  2017 [employer]  [employer] of the uterus can occur throughout pregnancy, but they are not always a sign that you are in labor. You may have practice contractions called Braxton Hicks contractions. These false labor contractions are sometimes confused with true labor. What are [place] contractions? Braxton Hicks contractions are tightening movements that occur in the muscles of the uterus before labor. Unlike true labor contractions, these contractions do not result in opening (dilation) and thinning of the cervix. Toward the end of pregnancy (32-34 weeks), Braxton Hicks contractions can happen more often and may become stronger. These contractions are sometimes difficult to tell apart from true labor because they can be very uncomfortable. You should not feel embarrassed if you go to the hospital with false labor. Sometimes, the only way to tell if you are in true labor is for your health care provider to look for changes in the cervix. The health care provider will do a physical exam and may monitor your contractions. If you are not in true labor, the exam should show that your cervix is not dilating and your water has not broken. If there are no prenatal problems or other health problems associated with your pregnancy, it is completely safe for you to be sent home with false labor. You may continue to have Braxton Hicks contractions until you go into true labor. How can I tell the difference between true labor and false labor?  Differences ? False labor ? Contractions last 30-70 seconds.: Contractions are usually shorter and not as strong as true labor contractions. ? Contractions become very regular.: Contractions are usually irregular. ? Discomfort is usually felt in the top of the uterus, and  it spreads to the lower abdomen and low back.: Contractions are often felt in the front of the lower abdomen and in the groin. ? Contractions do not go away with walking.: Contractions may go away when you walk around or change positions while lying down. ? Contractions usually become more intense and increase in frequency.: Contractions get weaker and are shorter-lasting as time goes on. ? The cervix dilates and gets thinner.: The cervix  usually does not dilate or become thin. Follow these instructions at home:  Take over-the-counter and prescription medicines only as told by your health care provider.  Keep up with your usual exercises and follow other instructions from your health care provider.  Eat and drink lightly if you think you are going into labor.  If Braxton Hicks contractions are making you uncomfortable: ? Change your position from lying down or resting to walking, or change from walking to resting. ? Sit and rest in a tub of warm water. ? Drink enough fluid to keep your urine clear or pale yellow. Dehydration may cause these contractions. ? Do slow and deep breathing several times an hour.  Keep all follow-up prenatal visits as told by your health care provider. This is important. Contact a health care provider if:  You have a fever.  You have continuous pain in your abdomen. Get help right away if:  Your contractions become stronger, more regular, and closer together.  You have fluid leaking or gushing from your vagina.  You pass blood-tinged mucus (bloody show).  You have bleeding from your vagina.  You have low back pain that you never had before.  You feel your babys head pushing down and causing pelvic pressure.  Your baby is not moving inside you as much as it used to. Summary  Contractions that occur before labor are called Braxton Hicks contractions, false labor, or practice contractions.  Braxton Hicks contractions are usually shorter, weaker,  farther apart, and less regular than true labor contractions. True labor contractions usually become progressively stronger and regular and they become more frequent.  Manage discomfort from [facility] contractions by changing position, resting in a warm bath, drinking plenty of water, or practicing deep breathing. This information is not intended to replace advice given to you by your health care provider. Make sure you discuss any questions you have with your health care provider. Document Released: 10/26/2005 Document Revised: 09/14/2016 Document Reviewed: 09/14/2016 Elsevier Interactive Patient Education  2017 [employer].

## 2017-09-29 ENCOUNTER — Inpatient Hospital Stay (HOSPITAL_COMMUNITY)
Admission: AD | Admit: 2017-09-29 | Discharge: 2017-09-29 | Disposition: A | Discharge status: Home / Self Care | Payer: PRIVATE HEALTH INSURANCE | Source: Ambulatory Visit | Attending: Obstetrics and Gynecology | Admitting: Obstetrics and Gynecology

## 2017-09-29 ENCOUNTER — Other Ambulatory Visit: Payer: Self-pay

## 2017-09-29 ENCOUNTER — Encounter (HOSPITAL_COMMUNITY): Payer: Self-pay

## 2017-09-29 DIAGNOSIS — O212 Late vomiting of pregnancy: Secondary | ICD-10-CM | POA: Insufficient documentation

## 2017-09-29 DIAGNOSIS — R51 Headache: Secondary | ICD-10-CM

## 2017-09-29 DIAGNOSIS — O4703 False labor before 37 completed weeks of gestation, third trimester: Secondary | ICD-10-CM | POA: Diagnosis not present

## 2017-09-29 DIAGNOSIS — Z3A36 36 weeks gestation of pregnancy: Secondary | ICD-10-CM | POA: Insufficient documentation

## 2017-09-29 DIAGNOSIS — O219 Vomiting of pregnancy, unspecified: Secondary | ICD-10-CM

## 2017-09-29 DIAGNOSIS — O26893 Other specified pregnancy related conditions, third trimester: Secondary | ICD-10-CM

## 2017-09-29 LAB — URINALYSIS, ROUTINE W REFLEX MICROSCOPIC
Bilirubin Urine: NEGATIVE
Glucose, UA: NEGATIVE mg/dL
Ketones, ur: 80 mg/dL — AB
Nitrite: NEGATIVE
Protein, ur: 100 mg/dL — AB
Specific Gravity, Urine: 1.027 (ref 1.005–1.030)
pH: 5 (ref 5.0–8.0)

## 2017-09-29 LAB — CBC
HCT: 36.6 % (ref 36.0–46.0)
Hemoglobin: 11.6 g/dL — ABNORMAL LOW (ref 12.0–15.0)
MCH: 26.1 pg (ref 26.0–34.0)
MCHC: 31.7 g/dL (ref 30.0–36.0)
MCV: 82.2 fL (ref 78.0–100.0)
Platelets: 276 10*3/uL (ref 150–400)
RBC: 4.45 MIL/uL (ref 3.87–5.11)
RDW: 14.2 % (ref 11.5–15.5)
WBC: 11.8 10*3/uL — ABNORMAL HIGH (ref 4.0–10.5)

## 2017-09-29 LAB — COMPREHENSIVE METABOLIC PANEL
ALT: 11 U/L — ABNORMAL LOW (ref 14–54)
AST: 18 U/L (ref 15–41)
Albumin: 3.1 g/dL — ABNORMAL LOW (ref 3.5–5.0)
Alkaline Phosphatase: 132 U/L — ABNORMAL HIGH (ref 38–126)
Anion gap: 12 (ref 5–15)
BUN: 10 mg/dL (ref 6–20)
CO2: 20 mmol/L — ABNORMAL LOW (ref 22–32)
Calcium: 9 mg/dL (ref 8.9–10.3)
Chloride: 104 mmol/L (ref 101–111)
Creatinine, Ser: 0.75 mg/dL (ref 0.44–1.00)
GFR calc Af Amer: >60 mL/min (ref >60)
GFR calc non Af Amer: >60 mL/min (ref >60)
Glucose, Bld: 89 mg/dL (ref 65–99)
Potassium: 4.1 mmol/L (ref 3.5–5.1)
Sodium: 136 mmol/L (ref 135–145)
Total Bilirubin: 0.7 mg/dL (ref 0.3–1.2)
Total Protein: 7.3 g/dL (ref 6.5–8.1)

## 2017-09-29 LAB — PROTEIN / CREATININE RATIO, URINE
Creatinine, Urine: 321 mg/dL
Protein Creatinine Ratio: 0.11 mg/mg{Cre} (ref 0.00–0.15)
Total Protein, Urine: 34 mg/dL

## 2017-09-29 MED ORDER — METOCLOPRAMIDE HCL 5 MG/ML IJ SOLN
10.0000 mg | Freq: Once | INTRAMUSCULAR | Status: AC
  Administered 2017-09-29: 10 mg via INTRAVENOUS
  Filled 2017-09-29: qty 2

## 2017-09-29 MED ORDER — DIPHENHYDRAMINE HCL 50 MG/ML IJ SOLN
12.5000 mg | Freq: Once | INTRAMUSCULAR | Status: DC
  Filled 2017-09-29: qty 1

## 2017-09-29 MED ORDER — LACTATED RINGERS IV SOLN
INTRAVENOUS | Status: DC
  Administered 2017-09-29: 14:00:00 via INTRAVENOUS

## 2017-09-29 MED ORDER — FAMOTIDINE IN NACL 20-0.9 MG/50ML-% IV SOLN
20.0000 mg | Freq: Once | INTRAVENOUS | Status: AC
  Administered 2017-09-29: 20 mg via INTRAVENOUS
  Filled 2017-09-29: qty 50

## 2017-09-29 MED ORDER — LACTATED RINGERS IV BOLUS (SEPSIS)
1000.0000 mL | Freq: Once | INTRAVENOUS | Status: AC
  Administered 2017-09-29: 1000 mL via INTRAVENOUS

## 2017-09-29 MED ORDER — DEXAMETHASONE SODIUM PHOSPHATE 10 MG/ML IJ SOLN
10.0000 mg | Freq: Once | INTRAMUSCULAR | Status: AC
  Administered 2017-09-29: 10 mg via INTRAVENOUS
  Filled 2017-09-29: qty 1

## 2017-09-29 MED ORDER — DIPHENHYDRAMINE HCL 50 MG/ML IJ SOLN
25.0000 mg | Freq: Once | INTRAMUSCULAR | Status: AC
  Administered 2017-09-29: 25 mg via INTRAVENOUS

## 2017-09-29 MED ORDER — ONDANSETRON HCL 4 MG/2ML IJ SOLN
4.0000 mg | Freq: Once | INTRAMUSCULAR | Status: AC
  Administered 2017-09-29: 4 mg via INTRAVENOUS
  Filled 2017-09-29: qty 2

## 2017-09-29 NOTE — [facility] Note (Signed)
Pt sent from office for elevated b/p , Headache and vomiting.

## 2017-09-29 NOTE — MAU Note (Signed)
Pt presents to [facility] from OB office with elevated BP's, severe headache and Ctx's. Pt denies vaginal bleeding and LOF. +FM

## 2017-09-29 NOTE — MAU Provider Note (Signed)
Chief Complaint:  Hypertension   First Provider Initiated Contact with Patient 09/29/17 1150      HPI: Emily Rhodes is a 31 y.o. G2P1001 at 25w2dwho presents to maternity admissions reporting headache, vision changes, nausea/vomiting and epigastric pain. Pt reports that she was sent over from the office. Headache started on Sunday- Tylenol 1,000mg  was taken yesterday with no relief. Currently rates 7/10. Headache is associated with vision changes, floaters, that started yesterday when she stands or moves. She reports that she has vomited over a dozen times today with little relief from PO Zofran that was taken yesterday. Epigastric pain is a new onset that started this morning with no improvement. She reports it is a sharp stabbing pain, "like a knife". She reports that she is not on any medication for hypertension and does not have any hx of hypertension with previous pregnancy.  She reports good fetal movement, denies LOF, vaginal bleeding, vaginal itching/burning, urinary symptoms, dizziness, or fever/chills.   She is scheduled for a primary C/S for MS on 10/19/2017.   Past Medical History: Past Medical History:  Diagnosis Date  . Fibroid   . Infection    UTI  . Multiple sclerosis ([place])   . Optic neuritis    rt eye    Past obstetric history: OB History  Gravida Para Term Preterm AB Living  2 1 1  0 0 1  SAB TAB Ectopic Multiple Live Births  0 0 0 0 1    # Outcome Date GA Lbr Len/2nd Weight Sex Delivery Anes PTL Lv  2 Current           1 Term 12/16/13 [redacted]w[redacted]d 05:39 / 00:58 7 lb 5.5 oz (3.33 kg) F Vag-Spont EPI  LIV      Past Surgical History: Past Surgical History:  Procedure Laterality Date  . spinal tap    . WISDOM TOOTH EXTRACTION     age 35    Family History: Family History  Problem Relation Age of Onset  . Diabetes Father   . Hypertension Father   . Hearing loss Neg Hx     Social History: Social History   Tobacco Use  . Smoking status: Never Smoker  .  Smokeless tobacco: Never Used  Substance Use Topics  . Alcohol use: No  . Drug use: No    Allergies:  Allergies  Allergen Reactions  . Bee Venom Swelling  . Percocet [Oxycodone-Acetaminophen] Nausea And Vomiting  . Tape Itching and Rash    Meds:  Medications Prior to Admission  Medication Sig Dispense Refill Last Dose  . famotidine (PEPCID) 20 MG tablet Take 1 tablet (20 mg total) by mouth 2 (two) times daily. 30 tablet 0 09/26/2017 at Unknown time  . ferrous sulfate 325 (65 FE) MG EC tablet Take 325 mg 3 (three) times daily with meals by mouth.   09/26/2017 at Unknown time  . Prenatal Vit-Fe Fumarate-FA (PRENATAL MULTIVITAMIN) TABS tablet Take 1 tablet by mouth daily at 12 noon.   Past Month at Unknown time  . promethazine (PHENERGAN) 25 MG tablet Take 1 tablet (25 mg total) by mouth every 6 (six) hours as needed for nausea or vomiting. 30 tablet 0 More than a month at Unknown time    ROS:  Review of Systems  Constitutional: Negative for chills, diaphoresis, fatigue and fever.  Respiratory: Negative for cough, chest tightness, shortness of breath and wheezing.   Cardiovascular: Negative for chest pain, palpitations and leg swelling.  Gastrointestinal: Positive for abdominal pain, nausea and  vomiting. Negative for abdominal distention, constipation and diarrhea.       Epigastric pain associated with abdominal pain    Genitourinary: Negative for difficulty urinating, dysuria, flank pain, frequency, urgency, vaginal bleeding and vaginal discharge.  Musculoskeletal: Negative for back pain and neck pain.  Neurological: Positive for headaches. Negative for weakness and light-headedness.       Vision changes, floaters, present    Psychiatric/Behavioral: Negative for agitation, behavioral problems and confusion.  All other systems reviewed and are negative.  I have reviewed patient's Past Medical Hx, Surgical Hx, Family Hx, Social Hx, medications and allergies.   Physical Exam    Patient Vitals for the past 24 hrs:  BP Temp Temp src Pulse Resp SpO2 Height Weight  09/29/17 1446 110/64 - - 78 - - - -  09/29/17 1346 111/71 - - 83 - - - -  09/29/17 1331 100/66 - - (!) 111 - - - -  09/29/17 1301 113/64 - - (!) 119 - - - -  09/29/17 1246 109/67 - - 86 - - - -  09/29/17 1231 113/72 - - 78 - - - -  09/29/17 1216 121/78 - - 82 - - - -  09/29/17 1201 119/86 - - 88 - - - -  09/29/17 1151 121/70 97.6 F (36.4 C) Oral 86 18 98 % - -  09/29/17 1142 - - - - - - 5\' 5"  (1.651 m) 236 lb (107 kg)   Constitutional: Well-developed, well-nourished female in no acute distress.  Cardiovascular: normal rate Respiratory: normal rate and effort GI: Abd soft, non-tender, gravid appropriate for gestational age.  MS: Extremities nontender, mild pedal edema, normal ROM, reflexes +1, negative for clonus  Neurologic: Alert and oriented x 4.  GU: Neg CVAT.  CERVICAL EXAM: Checked by Dr Melba Coon in office today- 4cm Rechecked, unchanged after 3 hours   Dilation: 4 Effacement (%): 50 Station: -2 Presentation: Vertex Exam by:: Darrol Poke, CNM   FHT:  Baseline 135 , moderate variability, accelerations present, no decelerations Contractions: q 3-4 mins   Labs: Results for orders placed or performed during the hospital encounter of 09/29/17 (from the past 24 hour(s))  Protein / creatinine ratio, urine     Status: None   Collection Time: 09/29/17 11:35 AM  Result Value Ref Range   Creatinine, Urine 321.00 mg/dL   Total Protein, Urine 34 mg/dL   Protein Creatinine Ratio 0.11 0.00 - 0.15 mg/mg[Cre]  Urinalysis, Routine w reflex microscopic     Status: Abnormal   Collection Time: 09/29/17 11:35 AM  Result Value Ref Range   Color, Urine YELLOW YELLOW   APPearance HAZY (A) CLEAR   Specific Gravity, Urine 1.027 1.005 - 1.030   pH 5.0 5.0 - 8.0   Glucose, UA NEGATIVE NEGATIVE mg/dL   Hgb urine dipstick SMALL (A) NEGATIVE   Bilirubin Urine NEGATIVE NEGATIVE   Ketones, ur 80 (A)  NEGATIVE mg/dL   Protein, ur 100 (A) NEGATIVE mg/dL   Nitrite NEGATIVE NEGATIVE   Leukocytes, UA SMALL (A) NEGATIVE   RBC / HPF 0-5 0 - 5 RBC/hpf   WBC, UA 6-30 0 - 5 WBC/hpf   Bacteria, UA RARE (A) NONE SEEN   Squamous Epithelial / LPF 6-30 (A) NONE SEEN   Mucus PRESENT   CBC     Status: Abnormal   Collection Time: 09/29/17 12:10 PM  Result Value Ref Range   WBC 11.8 (H) 4.0 - 10.5 K/uL   RBC 4.45 3.87 - 5.11 MIL/uL  Hemoglobin 11.6 (L) 12.0 - 15.0 g/dL   HCT 36.6 36.0 - 46.0 %   MCV 82.2 78.0 - 100.0 fL   MCH 26.1 26.0 - 34.0 pg   MCHC 31.7 30.0 - 36.0 g/dL   RDW 14.2 11.5 - 15.5 %   Platelets 276 150 - 400 K/uL  Comprehensive metabolic panel     Status: Abnormal   Collection Time: 09/29/17 12:10 PM  Result Value Ref Range   Sodium 136 135 - 145 mmol/L   Potassium 4.1 3.5 - 5.1 mmol/L   Chloride 104 101 - 111 mmol/L   CO2 20 (L) 22 - 32 mmol/L   Glucose, Bld 89 65 - 99 mg/dL   BUN 10 6 - 20 mg/dL   Creatinine, Ser 0.75 0.44 - 1.00 mg/dL   Calcium 9.0 8.9 - 10.3 mg/dL   Total Protein 7.3 6.5 - 8.1 g/dL   Albumin 3.1 (L) 3.5 - 5.0 g/dL   AST 18 15 - 41 U/L   ALT 11 (L) 14 - 54 U/L   Alkaline Phosphatase 132 (H) 38 - 126 U/L   Total Bilirubin 0.7 0.3 - 1.2 mg/dL   GFR calc non Af Amer >60 >60 mL/min   GFR calc Af Amer >60 >60 mL/min   Anion gap 12 5 - 15     MAU Course/MDM: I have ordered labs and reviewed results.  CBC CMP Protein/Creatinine ratio  Urinalysis  NST reviewed Treatments in MAU included 1,073ml Lactated ringers IV bolus over 2 hrs, Zofran 4mg  IV. Pepcid 20mg . Headache cocktail: Reglan 10mg  OV, decadron 10mg  IV, Benadryl 25mg  IV.   Consulted Dr Melba Coon with exam findings, test results and planning.   Today's evaluation included a work-up for preterm labor which can be life-threatening for both mom and baby. Headache most likely from sleep deprivation resolved with cocktail. Epigastric pain resolved by Pepcid and N/V resolved by Zofran dose. Pt  discharged with labor precautions and preeclampsia precautions.   Assessment: 1. Nausea and vomiting during pregnancy   2. Headache in pregnancy, third trimester   3. False labor before 37 completed weeks of gestation, third trimester     Plan: Discharge home  Labor precautions and fetal kick counts Preeclampsia precautions  Follow up in office as scheduled and return to MAU as needed for emergencies.   Allergies as of 09/29/2017      Reactions   Bee Venom Swelling   Percocet [oxycodone-acetaminophen] Nausea And Vomiting   Tape Itching, Rash      Medication List    TAKE these medications   acetaminophen 325 MG tablet Commonly known as:  TYLENOL Take 650 mg by mouth every 6 (six) hours as needed for headache.   famotidine 20 MG tablet Commonly known as:  PEPCID Take 1 tablet (20 mg total) by mouth 2 (two) times daily.   ferrous sulfate 325 (65 FE) MG EC tablet Take 325 mg 3 (three) times daily with meals by mouth.   promethazine 25 MG tablet Commonly known as:  PHENERGAN Take 1 tablet (25 mg total) by mouth every 6 (six) hours as needed for nausea or vomiting.      Darrol Poke Certified Nurse-Midwife 09/29/2017 12:02 PM

## 2017-10-01 ENCOUNTER — Inpatient Hospital Stay (HOSPITAL_COMMUNITY)
Admission: AD | Admit: 2017-10-01 | Discharge: 2017-10-01 | Disposition: A | Discharge status: Home / Self Care | Payer: PRIVATE HEALTH INSURANCE | Source: Ambulatory Visit | Attending: Obstetrics and Gynecology | Admitting: Obstetrics and Gynecology

## 2017-10-01 ENCOUNTER — Encounter (HOSPITAL_COMMUNITY): Payer: Self-pay | Admitting: *Deleted

## 2017-10-01 DIAGNOSIS — O99613 Diseases of the digestive system complicating pregnancy, third trimester: Secondary | ICD-10-CM | POA: Diagnosis not present

## 2017-10-01 DIAGNOSIS — O133 Gestational [pregnancy-induced] hypertension without significant proteinuria, third trimester: Secondary | ICD-10-CM | POA: Insufficient documentation

## 2017-10-01 DIAGNOSIS — K529 Noninfective gastroenteritis and colitis, unspecified: Secondary | ICD-10-CM | POA: Diagnosis not present

## 2017-10-01 DIAGNOSIS — O479 False labor, unspecified: Secondary | ICD-10-CM | POA: Diagnosis not present

## 2017-10-01 DIAGNOSIS — Z0371 Encounter for suspected problem with amniotic cavity and membrane ruled out: Secondary | ICD-10-CM | POA: Diagnosis not present

## 2017-10-01 DIAGNOSIS — R112 Nausea with vomiting, unspecified: Secondary | ICD-10-CM | POA: Diagnosis present

## 2017-10-01 DIAGNOSIS — Z3A36 36 weeks gestation of pregnancy: Secondary | ICD-10-CM | POA: Diagnosis not present

## 2017-10-01 LAB — CBC
HEMATOCRIT: 33.1 % — AB (ref 36.0–46.0)
HEMOGLOBIN: 10.7 g/dL — AB (ref 12.0–15.0)
MCH: 26.6 pg (ref 26.0–34.0)
MCHC: 32.3 g/dL (ref 30.0–36.0)
MCV: 82.3 fL (ref 78.0–100.0)
Platelets: 253 10*3/uL (ref 150–400)
RBC: 4.02 MIL/uL (ref 3.87–5.11)
RDW: 14.4 % (ref 11.5–15.5)
WBC: 8.8 10*3/uL (ref 4.0–10.5)

## 2017-10-01 LAB — COMPREHENSIVE METABOLIC PANEL
ALBUMIN: 2.9 g/dL — AB (ref 3.5–5.0)
ALK PHOS: 109 U/L (ref 38–126)
ALT: 11 U/L — AB (ref 14–54)
AST: 15 U/L (ref 15–41)
Anion gap: 11 (ref 5–15)
BILIRUBIN TOTAL: 0.8 mg/dL (ref 0.3–1.2)
BUN: 9 mg/dL (ref 6–20)
CO2: 20 mmol/L — ABNORMAL LOW (ref 22–32)
CREATININE: 0.74 mg/dL (ref 0.44–1.00)
Calcium: 8.7 mg/dL — ABNORMAL LOW (ref 8.9–10.3)
Chloride: 106 mmol/L (ref 101–111)
GFR calc Af Amer: >60 mL/min (ref >60)
GLUCOSE: 74 mg/dL (ref 65–99)
POTASSIUM: 3.8 mmol/L (ref 3.5–5.1)
Sodium: 137 mmol/L (ref 135–145)
TOTAL PROTEIN: 6.5 g/dL (ref 6.5–8.1)

## 2017-10-01 LAB — URINALYSIS, ROUTINE W REFLEX MICROSCOPIC
BILIRUBIN URINE: NEGATIVE
GLUCOSE, UA: NEGATIVE mg/dL
Hgb urine dipstick: NEGATIVE
KETONES UR: 5 mg/dL — AB
NITRITE: NEGATIVE
PH: 5 (ref 5.0–8.0)
PROTEIN: 100 mg/dL — AB
Specific Gravity, Urine: 1.027 (ref 1.005–1.030)
Squamous Epithelial / LPF: NONE SEEN

## 2017-10-01 LAB — AMYLASE: Amylase: 68 U/L (ref 28–100)

## 2017-10-01 LAB — LIPASE, BLOOD: Lipase: 20 U/L (ref 11–51)

## 2017-10-01 LAB — PROTEIN / CREATININE RATIO, URINE
Creatinine, Urine: 302 mg/dL
Protein Creatinine Ratio: 0.1 mg/mg{Cre} (ref 0.00–0.15)
Total Protein, Urine: 30 mg/dL

## 2017-10-01 MED ORDER — ACETAMINOPHEN 500 MG PO TABS
1000.0000 mg | ORAL_TABLET | Freq: Four times a day (QID) | ORAL | Status: DC | PRN
  Administered 2017-10-01: 1000 mg via ORAL
  Filled 2017-10-01: qty 2

## 2017-10-01 MED ORDER — DEXTROSE IN LACTATED RINGERS 5 % IV SOLN
Freq: Once | INTRAVENOUS | Status: AC
  Administered 2017-10-01: 17:00:00 via INTRAVENOUS

## 2017-10-01 MED ORDER — ONDANSETRON 8 MG PO TBDP: 8.0000 mg | ORAL_TABLET | Freq: Three times a day (TID) | ORAL | 2 refills | Status: DC | PRN

## 2017-10-01 MED ORDER — DEXAMETHASONE SODIUM PHOSPHATE 10 MG/ML IJ SOLN
10.0000 mg | Freq: Once | INTRAMUSCULAR | Status: AC
  Administered 2017-10-01: 10 mg via INTRAVENOUS
  Filled 2017-10-01: qty 1

## 2017-10-01 MED ORDER — ONDANSETRON HCL 40 MG/20ML IJ SOLN
8.0000 mg | Freq: Once | INTRAMUSCULAR | Status: AC
  Administered 2017-10-01: 8 mg via INTRAVENOUS
  Filled 2017-10-01: qty 4

## 2017-10-01 MED ORDER — FAMOTIDINE IN NACL 20-0.9 MG/50ML-% IV SOLN
20.0000 mg | Freq: Once | INTRAVENOUS | Status: AC
  Administered 2017-10-01: 20 mg via INTRAVENOUS
  Filled 2017-10-01: qty 50

## 2017-10-01 MED ORDER — PROMETHAZINE HCL 25 MG/ML IJ SOLN
25.0000 mg | Freq: Once | INTRAMUSCULAR | Status: AC
  Administered 2017-10-01: 25 mg via INTRAVENOUS
  Filled 2017-10-01: qty 1

## 2017-10-01 MED ORDER — PANTOPRAZOLE SODIUM 20 MG PO TBEC: 20.0000 mg | DELAYED_RELEASE_TABLET | Freq: Every day | ORAL | 2 refills | Status: AC

## 2017-10-01 MED ORDER — LACTATED RINGERS IV BOLUS (SEPSIS)
1000.0000 mL | Freq: Once | INTRAVENOUS | Status: AC
  Administered 2017-10-01: 1000 mL via INTRAVENOUS

## 2017-10-01 NOTE — MAU Provider Note (Signed)
Chief Complaint  Patient presents with  . Nausea  . Vaginal Discharge  . Contractions  . Headache     First Provider Initiated Contact with Patient 10/01/17 1502      S: Emily Rhodes  is a 31 y.o. y.o. year old G10P1001 female at [redacted]w[redacted]d weeks gestation who presents to MAU with N/V/D, dizziness, HA and incidental finding of elevated blood pressure. No Hx HTN. Current blood pressure medication: None.  N/V started 09/26/17. Diarrhea started yesterday, 09/30/17. Was seen in MAU for this problem 09/29/17. Felt much better that day and the next, then started vomiting again yesterday. Thought she had Zofran, but did not. Could not keep down phenergan tablets for N/V or Tylenol for HA.   Associated symptoms: Reports Headache, Denies vision changes, reports 6/10 epigastric pain. Neg for fever, chills, sick contacts.  Contractions: Reports  Vaginal bleeding: Denies Fetal movement: Nml LOF: Small amount of thin discharge  O:  Patient Vitals for the past 24 hrs:  BP Temp Pulse Resp  10/01/17 1831 (!) 98/51 - 77 -  10/01/17 1815 115/80 - 82 -  10/01/17 1800 127/65 - 75 -  10/01/17 1745 111/72 - (!) 106 -  10/01/17 1730 (!) 90/50 - 90 -  10/01/17 1715 121/69 - 90 -  10/01/17 1645 109/70 - 89 -  10/01/17 1630 123/82 - 90 -  10/01/17 1615 109/65 - 78 -  10/01/17 1608 116/63 - 69 -  10/01/17 1417 (!) 131/94 97.9 F (36.6 C) (!) 103 18   General: NAD Heart: Regular rate Lungs: Normal rate and effort Abd: Soft, NT, Gravid, S=D Extremities: neg Pedal edema Neuro: 2+ deep tendon reflexes, No clonus Pelvic: NEFG, no bleeding or LOF.   Dilation: 3.5 Effacement (%): 0 Cervical Position: Posterior Station: -3 Presentation: Vertex Exam by:: Manya Silvas, CNM  EFM: 135, Moderate variability, 15 x 15 accelerations, no decelerations Toco: UI  Results for orders placed or performed during the hospital encounter of 10/01/17 (from the past 24 hour(s))  Protein / creatinine ratio, urine      Status: None   Collection Time: 10/01/17  2:05 PM  Result Value Ref Range   Creatinine, Urine 302.00 mg/dL   Total Protein, Urine 30 mg/dL   Protein Creatinine Ratio 0.10 0.00 - 0.15 mg/mg[Cre]  Urinalysis, Routine w reflex microscopic     Status: Abnormal   Collection Time: 10/01/17  2:05 PM  Result Value Ref Range   Color, Urine AMBER (A) YELLOW   APPearance TURBID (A) CLEAR   Specific Gravity, Urine 1.027 1.005 - 1.030   pH 5.0 5.0 - 8.0   Glucose, UA NEGATIVE NEGATIVE mg/dL   Hgb urine dipstick NEGATIVE NEGATIVE   Bilirubin Urine NEGATIVE NEGATIVE   Ketones, ur 5 (A) NEGATIVE mg/dL   Protein, ur 100 (A) NEGATIVE mg/dL   Nitrite NEGATIVE NEGATIVE   Leukocytes, UA SMALL (A) NEGATIVE   RBC / HPF 0-5 0 - 5 RBC/hpf   WBC, UA 6-30 0 - 5 WBC/hpf   Bacteria, UA RARE (A) NONE SEEN   Squamous Epithelial / LPF NONE SEEN NONE SEEN   Mucus PRESENT    Amorphous Crystal PRESENT   CBC     Status: Abnormal   Collection Time: 10/01/17  3:45 PM  Result Value Ref Range   WBC 8.8 4.0 - 10.5 K/uL   RBC 4.02 3.87 - 5.11 MIL/uL   Hemoglobin 10.7 (L) 12.0 - 15.0 g/dL   HCT 33.1 (L) 36.0 - 46.0 %   MCV  82.3 78.0 - 100.0 fL   MCH 26.6 26.0 - 34.0 pg   MCHC 32.3 30.0 - 36.0 g/dL   RDW 14.4 11.5 - 15.5 %   Platelets 253 150 - 400 K/uL  Comprehensive metabolic panel     Status: Abnormal   Collection Time: 10/01/17  3:45 PM  Result Value Ref Range   Sodium 137 135 - 145 mmol/L   Potassium 3.8 3.5 - 5.1 mmol/L   Chloride 106 101 - 111 mmol/L   CO2 20 (L) 22 - 32 mmol/L   Glucose, Bld 74 65 - 99 mg/dL   BUN 9 6 - 20 mg/dL   Creatinine, Ser 0.74 0.44 - 1.00 mg/dL   Calcium 8.7 (L) 8.9 - 10.3 mg/dL   Total Protein 6.5 6.5 - 8.1 g/dL   Albumin 2.9 (L) 3.5 - 5.0 g/dL   AST 15 15 - 41 U/L   ALT 11 (L) 14 - 54 U/L   Alkaline Phosphatase 109 38 - 126 U/L   Total Bilirubin 0.8 0.3 - 1.2 mg/dL   GFR calc non Af Amer >60 >60 mL/min   GFR calc Af Amer >60 >60 mL/min   Anion gap 11 5 - 15  Amylase      Status: None   Collection Time: 10/01/17  3:45 PM  Result Value Ref Range   Amylase 68 28 - 100 U/L  Lipase, blood     Status: None   Collection Time: 10/01/17  3:45 PM  Result Value Ref Range   Lipase 20 11 - 51 U/L   MAU course/MDM Orders Placed This Encounter  Procedures  . CBC  . Comprehensive metabolic panel  . Protein / creatinine ratio, urine  . Urinalysis, Routine w reflex microscopic  . Amylase  . Lipase, blood  . Vital signs  . Contact ([place]) Isolation  . Insert peripheral IV  . Discharge patient   Meds ordered this encounter  Medications  . lactated ringers bolus 1,000 mL  . ondansetron (ZOFRAN) 8 mg in sodium chloride 0.9 % 50 mL IVPB  . acetaminophen (TYLENOL) tablet 1,000 mg  . dextrose 5 % in lactated ringers infusion  . promethazine (PHENERGAN) injection 25 mg  . famotidine (PEPCID) IVPB 20 mg premix  . dexamethasone (DECADRON) injection 10 mg  . pantoprazole (PROTONIX) 20 MG tablet    Sig: Take 1 tablet (20 mg total) by mouth daily.    Dispense:  30 tablet    Refill:  2    Order Specific Question:   Supervising Provider    Answer:   CONSTANT, PEGGY [[id number]]  . ondansetron (ZOFRAN ODT) 8 MG disintegrating tablet    Sig: Take 1 tablet (8 mg total) by mouth every 8 (eight) hours as needed for nausea or vomiting.    Dispense:  20 tablet    Refill:  2    Order Specific Question:   Supervising Provider    Answer:   CONSTANT, [place]   MDM - N/V/D. No further V/D in MAU. Dry-heaved after Zofran. Phenergan given. No further dry-heaving. Kept down PO fluids and Tylenol.  - Elevated BP x 1 w/ HA, but Nml labs. HA persisted after Tylenol, but pt has Hx HA's. Discussed w/ Dr. Marvel Plan. Low suspicion for Pre-E due to other BP's Nml or even low. May be R/T frequent vomiting or Hx MS. Will give dose of Decadron to see if that helps.   A: [redacted]w[redacted]d week IUP Transient hypertension in pregnancy.  Gastroenteritis, suspected infectious FHR  reactive  P:  Discharge home in stable condition per consult with [name], MD  Gastroenteritis precautions. Take Zofran ODT and phenergan on schedule. Clear liquid diet x 24 hours, then [employer].  Preeclampsia precautions. F/U at [facility] 10/07/17 or call the office sooner if Sx do not improve in the next 48-72 hours.   Follow-up Information    [facility] Follow up.   Why:  as scheduled or sooner as needed if symptoms worsen Contact information: [address]  [address] [place] [address] [phone]        [facility]ERN[facility] ADMISS[facility] Follow up.   Why:  as needed in emergencies Contact information: [address] [id number] [place] [address] [phone]          Tamala Julian [place], [place] 10/01/2017 8:03 PM

## 2017-10-01 NOTE — Discharge Instructions (Signed)
Viral Gastroenteritis, Adult Viral gastroenteritis is also known as the stomach flu. This condition is caused by various viruses. These viruses can be passed from person to person very easily (are very contagious). This condition may affect your stomach, small intestine, and large intestine. It can cause sudden watery diarrhea, fever, and vomiting. Diarrhea and vomiting can make you feel weak and cause you to become dehydrated. You may not be able to keep fluids down. Dehydration can make you tired and thirsty, cause you to have a dry mouth, and decrease how often you urinate. Older adults and people with other diseases or a weak immune system are at higher risk for dehydration. It is important to replace the fluids that you lose from diarrhea and vomiting. If you become severely dehydrated, you may need to get fluids through an IV tube. What are the causes? Gastroenteritis is caused by various viruses, including rotavirus and norovirus. Norovirus is the most common cause in adults. You can get sick by eating food, drinking water, or touching a surface contaminated with one of these viruses. You can also get sick from sharing utensils or other personal items with an infected person. What increases the risk? This condition is more likely to develop in people:  Who have a weak defense system (immune system).  Who live with one or more children who are younger than 80 years old.  Who live in a nursing home.  Who go on cruise ships.  What are the signs or symptoms? Symptoms of this condition start suddenly 1-2 days after exposure to a virus. Symptoms may last a few days or as long as a week. The most common symptoms are watery diarrhea and vomiting. Other symptoms include:  Fever.  Headache.  Fatigue.  Pain in the abdomen.  Chills.  Weakness.  Nausea.  Muscle aches.  Loss of appetite.  How is this diagnosed? This condition is diagnosed with a medical history and physical exam. You  may also have a stool test to check for viruses or other infections. How is this treated? This condition typically goes away on its own. The focus of treatment is to restore lost fluids (rehydration). Your health care provider may recommend that you take an oral rehydration solution (ORS) to replace important salts and minerals (electrolytes) in your body. Severe cases of this condition may require giving fluids through an IV tube. Treatment may also include medicine to help with your symptoms. Follow these instructions at home: Follow instructions from your health care provider about how to care for yourself at home. Eating and drinking Follow these recommendations as told by your health care provider:  Take an ORS. This is a drink that is sold at pharmacies and retail stores.  Drink clear fluids in small amounts as you are able. Clear fluids include water, ice chips, diluted fruit juice, and low-calorie sports drinks.  Eat bland, easy-to-digest foods in small amounts as you are able. These foods include bananas, applesauce, rice, lean meats, toast, and crackers.  Avoid fluids that contain a lot of sugar or caffeine, such as energy drinks, sports drinks, and soda.  Avoid alcohol.  Avoid spicy or fatty foods.  General instructions   Drink enough fluid to keep your urine clear or pale yellow.  Wash your hands often. If soap and water are not available, use hand sanitizer.  Make sure that all people in your household wash their hands well and often.  Take over-the-counter and prescription medicines only as told by your health  care provider.  Rest at home while you recover.  Watch your condition for any changes.  Take a warm bath to relieve any burning or pain from frequent diarrhea episodes.  Keep all follow-up visits as told by your health care provider. This is important. Contact a health care provider if:  You cannot keep fluids down.  Your symptoms get worse.  You have  new symptoms.  You feel light-headed or dizzy.  You have muscle cramps. Get help right away if:  You have chest pain.  You feel extremely weak or you faint.  You see blood in your vomit.  Your vomit looks like coffee grounds.  You have bloody or black stools or stools that look like tar.  You have a severe headache, a stiff neck, or both.  You have a rash.  You have severe pain, cramping, or bloating in your abdomen.  You have trouble breathing or you are breathing very quickly.  Your heart is beating very quickly.  Your skin feels cold and clammy.  You feel confused.  You have pain when you urinate.  You have signs of dehydration, such as: ? Dark urine, very little urine, or no urine. ? Cracked lips. ? Dry mouth. ? Sunken eyes. ? Sleepiness. ? Weakness. This information is not intended to replace advice given to you by your health care provider. Make sure you discuss any questions you have with your health care provider. Document Released: 10/26/2005 Document Revised: 04/08/2016 Document Reviewed: 07/02/2015 Elsevier Interactive Patient Education  2017 [place] Diet A bland diet consists of foods that do not have a lot of fat or fiber. Foods without fat or fiber are easier for the body to digest. They are also less likely to irritate your mouth, throat, stomach, and other parts of your gastrointestinal tract. A bland diet is sometimes called a BRAT diet. What is my plan? Your health care provider or dietitian may recommend specific changes to your diet to prevent and treat your symptoms, such as:  Eating small meals often.  Cooking food until it is soft enough to chew easily.  Chewing your food well.  Drinking fluids slowly.  Not eating foods that are very spicy, sour, or fatty.  Not eating citrus fruits, such as oranges and grapefruit.  What do I need to know about this diet?  Eat a variety of foods from the bland diet food list.  Do  not follow a bland diet longer than you have to.  Ask your health care provider whether you should take vitamins. What foods can I eat? Grains  Hot cereals, such as cream of wheat. Bread, crackers, or tortillas made from refined white flour. Rice. Vegetables Canned or cooked vegetables. Mashed or boiled potatoes. Fruits Bananas. Applesauce. Other types of cooked or canned fruit with the skin and seeds removed, such as canned peaches or pears. Meats and Other Protein Sources Scrambled eggs. Creamy peanut butter or other nut butters. Lean, well-cooked meats, such as chicken or fish. Tofu. Soups or broths. Dairy Low-fat dairy products, such as milk, cottage cheese, or yogurt. Beverages Water. Herbal tea. Apple juice. Sweets and Desserts Pudding. Custard. Fruit gelatin. Ice cream. Fats and Oils Mild salad dressings. Canola or olive oil. The items listed above may not be a complete list of allowed foods or beverages. Contact your dietitian for more options. What foods are not recommended? Foods and ingredients that are often not recommended include:  Spicy foods, such as hot sauce or  salsa.  [place] foods.  Sour foods, such as pickled or fermented foods.  Raw vegetables or fruits, especially citrus or berries.  Caffeinated drinks.  Alcohol.  Strongly flavored seasonings or condiments.  The items listed above may not be a complete list of foods and beverages that are not allowed. Contact your dietitian for more information. This information is not intended to replace advice given to you by your health care provider. Make sure you discuss any questions you have with your health care provider. Document Released: 02/17/2016 Document Revised: 04/02/2016 Document Reviewed: 11/07/2014 Elsevier Interactive Patient Education  2018 Elsevier Inc[employer]    [employer] of the uterus can occur throughout pregnancy, but they are not always a sign that you are in labor.  You may have practice contractions called Braxton Hicks contractions. These false labor contractions are sometimes confused with true labor. What are [place] contractions? Braxton Hicks contractions are tightening movements that occur in the muscles of the uterus before labor. Unlike true labor contractions, these contractions do not result in opening (dilation) and thinning of the cervix. Toward the end of pregnancy (32-34 weeks), Braxton Hicks contractions can happen more often and may become stronger. These contractions are sometimes difficult to tell apart from true labor because they can be very uncomfortable. You should not feel embarrassed if you go to the hospital with false labor. Sometimes, the only way to tell if you are in true labor is for your health care provider to look for changes in the cervix. The health care provider will do a physical exam and may monitor your contractions. If you are not in true labor, the exam should show that your cervix is not dilating and your water has not broken. If there are no prenatal problems or other health problems associated with your pregnancy, it is completely safe for you to be sent home with false labor. You may continue to have Braxton Hicks contractions until you go into true labor. How can I tell the difference between true labor and false labor?  Differences ? False labor ? Contractions last 30-70 seconds.: Contractions are usually shorter and not as strong as true labor contractions. ? Contractions become very regular.: Contractions are usually irregular. ? Discomfort is usually felt in the top of the uterus, and it spreads to the lower abdomen and low back.: Contractions are often felt in the front of the lower abdomen and in the groin. ? Contractions do not go away with walking.: Contractions may go away when you walk around or change positions while lying down. ? Contractions usually become more intense and increase in frequency.:  Contractions get weaker and are shorter-lasting as time goes on. ? The cervix dilates and gets thinner.: The cervix usually does not dilate or become thin. Follow these instructions at home:  Take over-the-counter and prescription medicines only as told by your health care provider.  Keep up with your usual exercises and follow other instructions from your health care provider.  Eat and drink lightly if you think you are going into labor.  If Braxton Hicks contractions are making you uncomfortable: ? Change your position from lying down or resting to walking, or change from walking to resting. ? Sit and rest in a tub of warm water. ? Drink enough fluid to keep your urine clear or pale yellow. Dehydration may cause these contractions. ? Do slow and deep breathing several times an hour.  Keep all follow-up prenatal visits as told by your health  care provider. This is important. Contact a health care provider if:  You have a fever.  You have continuous pain in your abdomen. Get help right away if:  Your contractions become stronger, more regular, and closer together.  You have fluid leaking or gushing from your vagina.  You pass blood-tinged mucus (bloody show).  You have bleeding from your vagina.  You have low back pain that you never had before.  You feel your babys head pushing down and causing pelvic pressure.  Your baby is not moving inside you as much as it used to. Summary  Contractions that occur before labor are called Braxton Hicks contractions, false labor, or practice contractions.  Braxton Hicks contractions are usually shorter, weaker, farther apart, and less regular than true labor contractions. True labor contractions usually become progressively stronger and regular and they become more frequent.  Manage discomfort from [facility] contractions by changing position, resting in a warm bath, drinking plenty of water, or practicing deep breathing. This  information is not intended to replace advice given to you by your health care provider. Make sure you discuss any questions you have with your health care provider. Document Released: 10/26/2005 Document Revised: 09/14/2016 Document Reviewed: 09/14/2016 Elsevier Interactive Patient Education  2017 [employer].

## 2017-10-05 ENCOUNTER — Encounter (HOSPITAL_COMMUNITY): Payer: Self-pay | Admitting: Obstetrics and Gynecology

## 2017-10-05 DIAGNOSIS — Z3490 Encounter for supervision of normal pregnancy, unspecified, unspecified trimester: Secondary | ICD-10-CM

## 2017-10-05 HISTORY — DX: Encounter for supervision of normal pregnancy, unspecified, unspecified trimester: Z34.90

## 2017-10-06 NOTE — H&P (Signed)
Emily Rhodes is a 31 y.o. female G2P1001 at  37+ with worsening vision - optic neuritis and MS for primary LTCS at suggestion of neurologist.   D/W pt r/b/a of LTCS.  Also desires permanent sterility, d/w pt r/b/a with do salpingectomy with LTCS.  Relatively uncomplicated PNC.  In past several weeks has been having blurring vision.  Similar with G1.  [facility].    OB History    Gravida Para Term Preterm AB Living   2 1 1  0 0 1   SAB TAB Ectopic Multiple Live Births   0 0 0 0 1    no abn pap, last 5/18 No STD  G1 7#5, female, SVD 2/15 G2 present  Past Medical History:  Diagnosis Date  . Fibroid   . Infection    UTI  . Multiple sclerosis ([place])   . Optic neuritis    rt eye  . Term pregnancy, repeat 10/05/2017   Past Surgical History:  Procedure Laterality Date  . spinal tap    . WISDOM TOOTH EXTRACTION     age 13   Family History: family history includes Diabetes in her father; Hypertension in her father. Social History:  reports that  has never smoked. she has never used smokeless tobacco. She reports that she does not drink alcohol or use drugs.married, day care teacher Meds PNV All NKDA     Maternal Diabetes: No Genetic Screening: Normal Maternal Ultrasounds/Referrals: Normal Fetal Ultrasounds or other Referrals:  None Maternal Substance Abuse:  No Significant Maternal Medications:  None Significant Maternal Lab Results:  Lab values include: Group B Strep negative Other Comments:  None  Review of Systems  Constitutional: Negative.   HENT: Negative.   Eyes: Negative.   Respiratory: Negative.   Cardiovascular: Negative.   Gastrointestinal: Negative.   Genitourinary: Negative.   Musculoskeletal: Negative.   Skin: Negative.   Neurological: Negative.   Psychiatric/Behavioral: Negative.    Maternal Medical History:  Contractions: Frequency: irregular.    Fetal activity: Perceived fetal activity is normal.    Prenatal complications: MS - worsening  vision - optic neuritis  Prenatal Complications - Diabetes: none.      Last menstrual period 01/11/2017, unknown if currently breastfeeding. Maternal Exam:  Uterine Assessment: Contraction strength is moderate.  Contraction frequency is irregular.   Abdomen: Patient reports no abdominal tenderness. Fundal height is appropriate for gestation.   Estimated fetal weight is 7-7#5.   Fetal presentation: vertex  Introitus: Normal vulva. Normal vagina.    Physical Exam  Constitutional: She is oriented to person, place, and time. She appears well-developed and well-nourished.  HENT:  Head: Normocephalic and atraumatic.  Cardiovascular: Normal rate and regular rhythm.  Respiratory: Effort normal and breath sounds normal. No respiratory distress. She has no wheezes.  GI: Soft. Bowel sounds are normal. She exhibits no distension. There is no tenderness.  Musculoskeletal: Normal range of motion.  Neurological: She is alert and oriented to person, place, and time.  Skin: Skin is warm and dry.  Psychiatric: She has a normal mood and affect. Her behavior is normal.    Prenatal labs: ABO, Rh:  A+ Antibody:  neg Rubella:  immune RPR:   NR HBsAg:   neg HIV:   neg GBS:   neg  Hgb 12.4/Plt 285/Ur Cx neg/GC neg/Chl neg/Varicella immune/First trimester screen WNL/glucola 164 - nl 3hrGTT/ CF, SMA and fragile X neg/  Nl anat, female, post plac  Assessment/Plan: 31yo G2P1001 at 37+ for LTCS - secondary to  MS/optic neuritis and worsening vision. D/w pt r/b/a of LTCS and BTL Ancef for prophylaxis D/w MFM, agree with delivery    Keno Caraway Bovard-Stuckert 10/06/2017, 9:20 PM

## 2017-10-07 ENCOUNTER — Inpatient Hospital Stay (HOSPITAL_COMMUNITY)
Admission: AD | Admit: 2017-10-07 | Discharge: 2017-10-10 | DRG: 784 | Disposition: A | Discharge status: Home / Self Care | Payer: No Typology Code available for payment source | Source: Ambulatory Visit | Attending: Obstetrics and Gynecology | Admitting: Obstetrics and Gynecology

## 2017-10-07 ENCOUNTER — Inpatient Hospital Stay (HOSPITAL_COMMUNITY): Payer: No Typology Code available for payment source | Admitting: Anesthesiology

## 2017-10-07 ENCOUNTER — Other Ambulatory Visit: Payer: Self-pay

## 2017-10-07 ENCOUNTER — Encounter (HOSPITAL_COMMUNITY)
Admission: AD | Disposition: A | Discharge status: Home / Self Care | Payer: Self-pay | Source: Ambulatory Visit | Attending: Obstetrics and Gynecology

## 2017-10-07 ENCOUNTER — Encounter (HOSPITAL_COMMUNITY): Payer: Self-pay | Admitting: *Deleted

## 2017-10-07 ENCOUNTER — Telehealth (HOSPITAL_COMMUNITY): Payer: Self-pay | Admitting: *Deleted

## 2017-10-07 DIAGNOSIS — O26893 Other specified pregnancy related conditions, third trimester: Principal | ICD-10-CM | POA: Diagnosis present

## 2017-10-07 DIAGNOSIS — Z302 Encounter for sterilization: Secondary | ICD-10-CM

## 2017-10-07 DIAGNOSIS — Z98891 History of uterine scar from previous surgery: Secondary | ICD-10-CM

## 2017-10-07 DIAGNOSIS — O9902 Anemia complicating childbirth: Secondary | ICD-10-CM | POA: Diagnosis present

## 2017-10-07 DIAGNOSIS — G35 Multiple sclerosis: Secondary | ICD-10-CM | POA: Diagnosis present

## 2017-10-07 DIAGNOSIS — Z3490 Encounter for supervision of normal pregnancy, unspecified, unspecified trimester: Secondary | ICD-10-CM

## 2017-10-07 DIAGNOSIS — H469 Unspecified optic neuritis: Secondary | ICD-10-CM | POA: Diagnosis present

## 2017-10-07 DIAGNOSIS — D649 Anemia, unspecified: Secondary | ICD-10-CM | POA: Diagnosis present

## 2017-10-07 DIAGNOSIS — Z3A37 37 weeks gestation of pregnancy: Secondary | ICD-10-CM

## 2017-10-07 HISTORY — DX: Encounter for supervision of normal pregnancy, unspecified, unspecified trimester: Z34.90

## 2017-10-07 LAB — CBC
HCT: 34.3 % — ABNORMAL LOW (ref 36.0–46.0)
Hemoglobin: 11 g/dL — ABNORMAL LOW (ref 12.0–15.0)
MCH: 26.4 pg (ref 26.0–34.0)
MCHC: 32.1 g/dL (ref 30.0–36.0)
MCV: 82.5 fL (ref 78.0–100.0)
PLATELETS: 221 10*3/uL (ref 150–400)
RBC: 4.16 MIL/uL (ref 3.87–5.11)
RDW: 14.6 % (ref 11.5–15.5)
WBC: 7.9 10*3/uL (ref 4.0–10.5)

## 2017-10-07 LAB — COMPREHENSIVE METABOLIC PANEL
ALK PHOS: 108 U/L (ref 38–126)
ALT: 10 U/L — ABNORMAL LOW (ref 14–54)
ANION GAP: 10 (ref 5–15)
AST: 16 U/L (ref 15–41)
Albumin: 2.7 g/dL — ABNORMAL LOW (ref 3.5–5.0)
BILIRUBIN TOTAL: 1.1 mg/dL (ref 0.3–1.2)
BUN: 8 mg/dL (ref 6–20)
CALCIUM: 8.4 mg/dL — AB (ref 8.9–10.3)
CO2: 18 mmol/L — AB (ref 22–32)
Chloride: 106 mmol/L (ref 101–111)
Creatinine, Ser: 0.65 mg/dL (ref 0.44–1.00)
GFR calc non Af Amer: >60 mL/min (ref >60)
Glucose, Bld: 86 mg/dL (ref 65–99)
Potassium: 4.5 mmol/L (ref 3.5–5.1)
SODIUM: 134 mmol/L — AB (ref 135–145)
Total Protein: 5.9 g/dL — ABNORMAL LOW (ref 6.5–8.1)

## 2017-10-07 LAB — TYPE AND SCREEN
ABO/RH(D): A POS
Antibody Screen: NEGATIVE

## 2017-10-07 SURGERY — Surgical Case
Anesthesia: Spinal | Wound class: Clean Contaminated

## 2017-10-07 MED ORDER — HYDROMORPHONE HCL 1 MG/ML IJ SOLN: 0.2500 mg | INTRAMUSCULAR | Status: DC | PRN

## 2017-10-07 MED ORDER — GLYCOPYRROLATE 0.2 MG/ML IJ SOLN
INTRAMUSCULAR | Status: AC
  Filled 2017-10-07: qty 1

## 2017-10-07 MED ORDER — PANTOPRAZOLE SODIUM 20 MG PO TBEC
20.0000 mg | DELAYED_RELEASE_TABLET | Freq: Every day | ORAL | Status: DC
  Administered 2017-10-08 – 2017-10-10 (×3): 20 mg via ORAL
  Filled 2017-10-07 (×4): qty 1

## 2017-10-07 MED ORDER — SIMETHICONE 80 MG PO CHEW
80.0000 mg | CHEWABLE_TABLET | Freq: Three times a day (TID) | ORAL | Status: DC
  Administered 2017-10-08 – 2017-10-10 (×5): 80 mg via ORAL
  Filled 2017-10-07 (×6): qty 1

## 2017-10-07 MED ORDER — ACETAMINOPHEN 325 MG PO TABS
650.0000 mg | ORAL_TABLET | ORAL | Status: DC | PRN
  Administered 2017-10-07 – 2017-10-09 (×5): 650 mg via ORAL
  Filled 2017-10-07 (×5): qty 2

## 2017-10-07 MED ORDER — PRENATAL MULTIVITAMIN CH
1.0000 | ORAL_TABLET | Freq: Every day | ORAL | Status: DC
  Filled 2017-10-07 (×2): qty 1

## 2017-10-07 MED ORDER — DIPHENHYDRAMINE HCL 25 MG PO CAPS: 25.0000 mg | ORAL_CAPSULE | Freq: Four times a day (QID) | ORAL | Status: DC | PRN

## 2017-10-07 MED ORDER — OXYTOCIN 10 UNIT/ML IJ SOLN
INTRAVENOUS | Status: DC | PRN
  Administered 2017-10-07: 40 [IU] via INTRAVENOUS

## 2017-10-07 MED ORDER — ONDANSETRON HCL 4 MG/2ML IJ SOLN
INTRAMUSCULAR | Status: DC | PRN
  Administered 2017-10-07: 4 mg via INTRAVENOUS

## 2017-10-07 MED ORDER — COCONUT OIL OIL: 1.0000 "application " | TOPICAL_OIL | Status: DC | PRN

## 2017-10-07 MED ORDER — LACTATED RINGERS IV SOLN
INTRAVENOUS | Status: DC
  Administered 2017-10-07 (×4): via INTRAVENOUS

## 2017-10-07 MED ORDER — CEFAZOLIN SODIUM-DEXTROSE 2-3 GM-%(50ML) IV SOLR
INTRAVENOUS | Status: AC
  Filled 2017-10-07: qty 50

## 2017-10-07 MED ORDER — ERYTHROMYCIN 5 MG/GM OP OINT
TOPICAL_OINTMENT | OPHTHALMIC | Status: DC
Start: 2017-10-07 — End: 2017-10-07
  Filled 2017-10-07: qty 1

## 2017-10-07 MED ORDER — DEXAMETHASONE SODIUM PHOSPHATE 10 MG/ML IJ SOLN
INTRAMUSCULAR | Status: AC
  Filled 2017-10-07: qty 1

## 2017-10-07 MED ORDER — DIBUCAINE 1 % RE OINT: 1.0000 "application " | TOPICAL_OINTMENT | RECTAL | Status: DC | PRN

## 2017-10-07 MED ORDER — NALOXONE HCL 0.4 MG/ML IJ SOLN: 0.4000 mg | INTRAMUSCULAR | Status: DC | PRN

## 2017-10-07 MED ORDER — HYDROMORPHONE HCL 1 MG/ML IJ SOLN
INTRAMUSCULAR | Status: AC
  Filled 2017-10-07: qty 1

## 2017-10-07 MED ORDER — PROMETHAZINE HCL 25 MG/ML IJ SOLN: 6.2500 mg | INTRAMUSCULAR | Status: DC | PRN

## 2017-10-07 MED ORDER — ONDANSETRON HCL 4 MG/2ML IJ SOLN
INTRAMUSCULAR | Status: AC
  Filled 2017-10-07: qty 2

## 2017-10-07 MED ORDER — NALBUPHINE HCL 10 MG/ML IJ SOLN: 5.0000 mg | Freq: Once | INTRAMUSCULAR | Status: DC | PRN

## 2017-10-07 MED ORDER — MORPHINE SULFATE (PF) 0.5 MG/ML IJ SOLN
INTRAMUSCULAR | Status: AC
  Filled 2017-10-07: qty 10

## 2017-10-07 MED ORDER — WITCH HAZEL-GLYCERIN EX PADS: 1.0000 "application " | MEDICATED_PAD | CUTANEOUS | Status: DC | PRN

## 2017-10-07 MED ORDER — OXYTOCIN 10 UNIT/ML IJ SOLN
INTRAMUSCULAR | Status: AC
  Filled 2017-10-07: qty 4

## 2017-10-07 MED ORDER — CEFAZOLIN SODIUM-DEXTROSE 2-4 GM/100ML-% IV SOLN
2.0000 g | INTRAVENOUS | Status: AC
  Administered 2017-10-07: 2 g via INTRAVENOUS
  Filled 2017-10-07: qty 100

## 2017-10-07 MED ORDER — NALBUPHINE HCL 10 MG/ML IJ SOLN
5.0000 mg | INTRAMUSCULAR | Status: DC | PRN
Start: 2017-10-07 — End: 2017-10-10

## 2017-10-07 MED ORDER — GLYCOPYRROLATE 0.2 MG/ML IJ SOLN
INTRAMUSCULAR | Status: DC | PRN
  Administered 2017-10-07: 0.1 mg via INTRAVENOUS

## 2017-10-07 MED ORDER — ZOLPIDEM TARTRATE 5 MG PO TABS: 5.0000 mg | ORAL_TABLET | Freq: Every evening | ORAL | Status: DC | PRN

## 2017-10-07 MED ORDER — MORPHINE SULFATE (PF) 0.5 MG/ML IJ SOLN
INTRAMUSCULAR | Status: DC | PRN
  Administered 2017-10-07: .2 mg via INTRATHECAL

## 2017-10-07 MED ORDER — PHENYLEPHRINE 40 MCG/ML (10ML) SYRINGE FOR IV PUSH (FOR BLOOD PRESSURE SUPPORT)
PREFILLED_SYRINGE | INTRAVENOUS | Status: AC
  Filled 2017-10-07: qty 10

## 2017-10-07 MED ORDER — BUPIVACAINE IN DEXTROSE 0.75-8.25 % IT SOLN
INTRATHECAL | Status: DC | PRN
  Administered 2017-10-07: 11.2 mg via INTRATHECAL

## 2017-10-07 MED ORDER — OXYTOCIN 40 UNITS IN LACTATED RINGERS INFUSION - SIMPLE MED: 2.5000 [IU]/h | INTRAVENOUS | Status: AC

## 2017-10-07 MED ORDER — LACTATED RINGERS IV SOLN: INTRAVENOUS | Status: DC

## 2017-10-07 MED ORDER — SODIUM CHLORIDE 0.9% FLUSH: 3.0000 mL | INTRAVENOUS | Status: DC | PRN

## 2017-10-07 MED ORDER — SIMETHICONE 80 MG PO CHEW
80.0000 mg | CHEWABLE_TABLET | ORAL | Status: DC
  Administered 2017-10-07 – 2017-10-09 (×3): 80 mg via ORAL
  Filled 2017-10-07 (×3): qty 1

## 2017-10-07 MED ORDER — PHENYLEPHRINE HCL 10 MG/ML IJ SOLN
INTRAMUSCULAR | Status: DC | PRN
  Administered 2017-10-07 (×5): 80 ug via INTRAVENOUS

## 2017-10-07 MED ORDER — MENTHOL 3 MG MT LOZG: 1.0000 | LOZENGE | OROMUCOSAL | Status: DC | PRN

## 2017-10-07 MED ORDER — VITAMIN K1 1 MG/0.5ML IJ SOLN
INTRAMUSCULAR | Status: AC
  Filled 2017-10-07: qty 0.5

## 2017-10-07 MED ORDER — SENNOSIDES-DOCUSATE SODIUM 8.6-50 MG PO TABS
2.0000 | ORAL_TABLET | ORAL | Status: DC
  Administered 2017-10-07 – 2017-10-09 (×3): 2 via ORAL
  Filled 2017-10-07 (×3): qty 2

## 2017-10-07 MED ORDER — DIPHENHYDRAMINE HCL 25 MG PO CAPS: 25.0000 mg | ORAL_CAPSULE | ORAL | Status: DC | PRN

## 2017-10-07 MED ORDER — TRAMADOL HCL 50 MG PO TABS
50.0000 mg | ORAL_TABLET | Freq: Four times a day (QID) | ORAL | Status: DC | PRN
  Administered 2017-10-08 – 2017-10-09 (×5): 50 mg via ORAL
  Filled 2017-10-07 (×5): qty 1

## 2017-10-07 MED ORDER — SCOPOLAMINE 1 MG/3DAYS TD PT72: 1.0000 | MEDICATED_PATCH | Freq: Once | TRANSDERMAL | Status: DC

## 2017-10-07 MED ORDER — DEXAMETHASONE SODIUM PHOSPHATE 10 MG/ML IJ SOLN
INTRAMUSCULAR | Status: DC | PRN
  Administered 2017-10-07: 10 mg via INTRAVENOUS

## 2017-10-07 MED ORDER — NALOXONE HCL 0.4 MG/ML IJ SOLN: 1.0000 ug/kg/h | INTRAVENOUS | Status: DC | PRN

## 2017-10-07 MED ORDER — ONDANSETRON HCL 4 MG/2ML IJ SOLN: 4.0000 mg | Freq: Three times a day (TID) | INTRAMUSCULAR | Status: DC | PRN

## 2017-10-07 MED ORDER — PHENYLEPHRINE 8 MG IN D5W 100 ML (0.08MG/ML) PREMIX OPTIME
INJECTION | INTRAVENOUS | Status: DC | PRN
  Administered 2017-10-07: 60 ug/min via INTRAVENOUS

## 2017-10-07 MED ORDER — IBUPROFEN 800 MG PO TABS
800.0000 mg | ORAL_TABLET | Freq: Three times a day (TID) | ORAL | Status: DC
  Administered 2017-10-07 – 2017-10-10 (×7): 800 mg via ORAL
  Filled 2017-10-07 (×8): qty 1

## 2017-10-07 MED ORDER — FENTANYL CITRATE (PF) 100 MCG/2ML IJ SOLN
INTRAMUSCULAR | Status: DC | PRN
  Administered 2017-10-07: 10 ug via INTRATHECAL

## 2017-10-07 MED ORDER — FENTANYL CITRATE (PF) 100 MCG/2ML IJ SOLN
INTRAMUSCULAR | Status: AC
  Filled 2017-10-07: qty 2

## 2017-10-07 MED ORDER — DIPHENHYDRAMINE HCL 50 MG/ML IJ SOLN: 12.5000 mg | INTRAMUSCULAR | Status: DC | PRN

## 2017-10-07 MED ORDER — SIMETHICONE 80 MG PO CHEW: 80.0000 mg | CHEWABLE_TABLET | ORAL | Status: DC | PRN

## 2017-10-07 MED ORDER — NALBUPHINE HCL 10 MG/ML IJ SOLN: 5.0000 mg | INTRAMUSCULAR | Status: DC | PRN

## 2017-10-07 SURGICAL SUPPLY — 38 items
BENZOIN TINCTURE PRP APPL 2/3 (GAUZE/BANDAGES/DRESSINGS) ×3 IMPLANT
CHLORAPREP W/TINT 26ML (MISCELLANEOUS) ×3 IMPLANT
CLAMP CORD UMBIL (MISCELLANEOUS) IMPLANT
CLOSURE STERI STRIP 1/2 X4 (GAUZE/BANDAGES/DRESSINGS) ×2 IMPLANT
CLOSURE WOUND 1/2 X4 (GAUZE/BANDAGES/DRESSINGS) ×1
CLOTH BEACON ORANGE TIMEOUT ST (SAFETY) ×3 IMPLANT
CONTAINER PREFILL 10% NBF 15ML (MISCELLANEOUS) IMPLANT
DRSG OPSITE POSTOP 4X10 (GAUZE/BANDAGES/DRESSINGS) ×3 IMPLANT
ELECT REM PT RETURN 9FT ADLT (ELECTROSURGICAL) ×3
ELECTRODE REM PT RTRN 9FT ADLT (ELECTROSURGICAL) ×1 IMPLANT
EXTRACTOR VACUUM M CUP 4 TUBE (SUCTIONS) IMPLANT
EXTRACTOR VACUUM M CUP 4' TUBE (SUCTIONS)
GLOVE BIO SURGEON STRL SZ 6.5 (GLOVE) ×2 IMPLANT
GLOVE BIO SURGEONS STRL SZ 6.5 (GLOVE) ×1
GLOVE BIOGEL PI IND STRL 7.0 (GLOVE) ×1 IMPLANT
GLOVE BIOGEL PI INDICATOR 7.0 (GLOVE) ×2
GOWN STRL REUS W/TWL LRG LVL3 (GOWN DISPOSABLE) ×6 IMPLANT
KIT ABG SYR 3ML LUER SLIP (SYRINGE) IMPLANT
NEEDLE HYPO 25X5/8 SAFETYGLIDE (NEEDLE) IMPLANT
NS IRRIG 1000ML POUR BTL (IV SOLUTION) ×3 IMPLANT
PACK C SECTION WH (CUSTOM PROCEDURE TRAY) ×3 IMPLANT
PAD OB MATERNITY 4.3X12.25 (PERSONAL CARE ITEMS) ×3 IMPLANT
PENCIL SMOKE EVAC W/HOLSTER (ELECTROSURGICAL) ×3 IMPLANT
RTRCTR C-SECT PINK 25CM LRG (MISCELLANEOUS) ×3 IMPLANT
STRIP CLOSURE SKIN 1/2X4 (GAUZE/BANDAGES/DRESSINGS) ×2 IMPLANT
SUT MNCRL 0 VIOLET CTX 36 (SUTURE) ×2 IMPLANT
SUT MONOCRYL 0 CTX 36 (SUTURE) ×4
SUT PLAIN 1 NONE 54 (SUTURE) ×3 IMPLANT
SUT PLAIN 2 0 XLH (SUTURE) ×6 IMPLANT
SUT VIC AB 0 CT1 27 (SUTURE) ×4
SUT VIC AB 0 CT1 27XBRD ANBCTR (SUTURE) ×2 IMPLANT
SUT VIC AB 2-0 CT1 (SUTURE) ×3 IMPLANT
SUT VIC AB 2-0 CT1 27 (SUTURE) ×2
SUT VIC AB 2-0 CT1 TAPERPNT 27 (SUTURE) ×1 IMPLANT
SUT VIC AB 4-0 KS 27 (SUTURE) ×3 IMPLANT
SYR BULB IRRIGATION 50ML (SYRINGE) ×3 IMPLANT
TOWEL OR 17X24 6PK STRL BLUE (TOWEL DISPOSABLE) ×3 IMPLANT
TRAY FOLEY BAG SILVER LF 14FR (SET/KITS/TRAYS/PACK) IMPLANT

## 2017-10-07 NOTE — Interval H&P Note (Signed)
History and Physical Interval Note:  10/07/2017 12:06 PM  Emily Rhodes  has presented today for surgery, with the diagnosis of neurology, reccommends delivery via c-section   The various methods of treatment have been discussed with the patient and family. After consideration of risks, benefits and other options for treatment, the patient has consented to  Procedure(s) with comments: [place] (N/A) - MD request RNFA as a surgical intervention .  The patient's history has been reviewed, patient examined, no change in status, stable for surgery.  I have reviewed the patient's chart and labs.  Questions were answered to the patient's satisfaction.     Jachelle Fluty Bovard-Stuckert

## 2017-10-07 NOTE — Brief Op Note (Signed)
10/07/2017  2:27 PM  PATIENT:  Emily Rhodes  31 y.o. female  PRE-OPERATIVE DIAGNOSIS:  neurology, reccommends delivery via c-section (worsening MS sx's) IUP at term, undesired fertility  POST-OPERATIVE DIAGNOSIS:   neurology, reccommends delivery via c-section (worsening MS sx's) IUP at term, undesired fertility, delivered  PROCEDURE:  Procedure(s) with comments: PRIMARY CESAREAN SECTION WITH BILATERAL TUBAL LIGATION (N/A) - MD request RNFA  SURGEON:  Surgeon(s) and Role:    * Bovard-Stuckert, Domanick Cuccia, MD - Primary  ASSISTANTS: Claretta Fraise, RNFA   ANESTHESIA:   spinal  FINDINGS: viable female infant at 13:25, apgars 9/9, wt P; nl uterus, tubes and ovaries.    EBL:  800 mL   BLOOD ADMINISTERED:none  DRAINS: Urinary Catheter (Foley)   LOCAL MEDICATIONS USED:  NONE  SPECIMEN:  Source of Specimen:  Placenta and B tubal segments  DISPOSITION OF SPECIMEN:  L&D and Pathology  COUNTS:  YES  TOURNIQUET:  * No tourniquets in log *  DICTATION: .Other Dictation: Dictation Number [id number]  PLAN OF CARE: Admit to inpatient   PATIENT DISPOSITION:  PACU - hemodynamically stable.   Delay start of Pharmacological VTE agent (>24hrs) due to surgical blood loss or risk of bleeding: not applicable

## 2017-10-07 NOTE — Anesthesia Preprocedure Evaluation (Addendum)
Anesthesia Evaluation  Patient identified by MRN, date of birth, ID band Patient awake    Reviewed: Allergy & Precautions, NPO status , Patient's Chart, lab work & pertinent test results  Airway Mallampati: III  TM Distance: >3 FB Neck ROM: Full    Dental no notable dental hx.    Pulmonary neg pulmonary ROS,    Pulmonary exam normal breath sounds clear to auscultation       Cardiovascular negative cardio ROS Normal cardiovascular exam Rhythm:Regular Rate:Normal     Neuro/Psych Optic neuritis, right eye Multiple sclerosis negative neurological ROS  negative psych ROS   GI/Hepatic Neg liver ROS, GERD  Medicated and Controlled,  Endo/Other  negative endocrine ROS  Renal/GU negative Renal ROS     Musculoskeletal negative musculoskeletal ROS (+)   Abdominal (+) + obese,   Peds  Hematology  (+) anemia ,   Anesthesia Other Findings   Reproductive/Obstetrics                            Anesthesia Physical Anesthesia Plan  ASA: III  Anesthesia Plan: Spinal   Post-op Pain Management:    Induction: Intravenous  PONV Risk Score and Plan: 2 and Ondansetron and Treatment may vary due to age or medical condition  Airway Management Planned: Natural Airway  Additional Equipment:   Intra-op Plan:   Post-operative Plan:   Informed Consent: I have reviewed the patients History and Physical, chart, labs and discussed the procedure including the risks, benefits and alternatives for the proposed anesthesia with the patient or authorized representative who has indicated his/her understanding and acceptance.   Dental advisory given  Plan Discussed with: CRNA  Anesthesia Plan Comments:         Anesthesia Quick Evaluation

## 2017-10-07 NOTE — Transfer of Care (Signed)
Immediate Anesthesia Transfer of Care Note  Patient: Emily Rhodes  Procedure(s) Performed: PRIMARY CESAREAN SECTION WITH BILATERAL TUBAL LIGATION (N/A )  Patient Location: PACU  Anesthesia Type:Spinal  Level of Consciousness: awake  Airway & Oxygen Therapy: Patient Spontanous Breathing  Post-op Assessment: Report given to RN  Post vital signs: Reviewed and stable  Last Vitals:  Vitals:   10/07/17 1112  BP: 122/80  Pulse: 97  Resp: 20  Temp: (!) 36.3 C    Last Pain:  Vitals:   10/07/17 1116  TempSrc:   PainSc: 3          Complications: No apparent anesthesia complications and Patient re-intubated

## 2017-10-07 NOTE — Telephone Encounter (Signed)
Preadmission screen  

## 2017-10-07 NOTE — Anesthesia Postprocedure Evaluation (Signed)
Anesthesia Post Note  Patient: Emily Rhodes  Procedure(s) Performed: PRIMARY CESAREAN SECTION WITH BILATERAL TUBAL LIGATION (N/A )     Patient location during evaluation: PACU Anesthesia Type: Spinal Level of consciousness: oriented and awake and alert Pain management: pain level controlled Vital Signs Assessment: post-procedure vital signs reviewed and stable Respiratory status: spontaneous breathing, respiratory function stable and patient connected to nasal cannula oxygen Cardiovascular status: blood pressure returned to baseline and stable Postop Assessment: no headache, no backache and no apparent nausea or vomiting Anesthetic complications: no    Last Vitals:  Vitals:   10/07/17 1530 10/07/17 1559  BP: 107/78 128/72  Pulse: 93 66  Resp: (!) 26 18  Temp:  36.6 C  SpO2:  99%    Last Pain:  Vitals:   10/07/17 1630  TempSrc:   PainSc: 6    Pain Goal:                 Karyl Kinnier Ellender

## 2017-10-08 ENCOUNTER — Encounter (HOSPITAL_COMMUNITY): Payer: Self-pay | Admitting: *Deleted

## 2017-10-08 ENCOUNTER — Other Ambulatory Visit: Payer: Self-pay

## 2017-10-08 LAB — CBC
HEMATOCRIT: 24.9 % — AB (ref 36.0–46.0)
HEMOGLOBIN: 8.2 g/dL — AB (ref 12.0–15.0)
MCH: 27.2 pg (ref 26.0–34.0)
MCHC: 32.9 g/dL (ref 30.0–36.0)
MCV: 82.7 fL (ref 78.0–100.0)
Platelets: 198 10*3/uL (ref 150–400)
RBC: 3.01 MIL/uL — ABNORMAL LOW (ref 3.87–5.11)
RDW: 14.5 % (ref 11.5–15.5)
WBC: 12.5 10*3/uL — ABNORMAL HIGH (ref 4.0–10.5)

## 2017-10-08 LAB — RPR: RPR Ser Ql: NONREACTIVE

## 2017-10-08 NOTE — Progress Notes (Signed)
Subjective: Postpartum Day 1: Cesarean Delivery Patient reports tolerating PO and no problems voiding.  Pain controlled with po meds.  Pt says since last night she feels the best she has in a good while.  Vision changes improved.   Objective: Vital signs in last 24 hours: Temp:  [97.4 F (36.3 C)-99 F (37.2 C)] 98.8 F (37.1 C) (11/30 0300) Pulse Rate:  [64-100] 100 (11/29 2307) Resp:  [16-26] 16 (11/29 2307) BP: (97-128)/(62-84) 119/68 (11/29 2307) SpO2:  [98 %-100 %] 99 % (11/30 0300) Weight:  [108.4 kg (239 lb)] 108.4 kg (239 lb) (11/29 1112)  Physical Exam:  General: alert and cooperative Lochia: appropriate Uterine Fundus: firm Incision: C/D/I   Recent Labs    10/07/17 1102 10/08/17 0530  HGB 11.0* 8.2*  HCT 34.3* 24.9*    Assessment/Plan: Status post Cesarean section. Doing well postoperatively.  Continue current care.  Logan Bores 10/08/2017, 8:37 AM

## 2017-10-08 NOTE — Anesthesia Postprocedure Evaluation (Signed)
Anesthesia Post Note  Patient: Emily Rhodes  Procedure(s) Performed: PRIMARY CESAREAN SECTION WITH BILATERAL TUBAL LIGATION (N/A )     Patient location during evaluation: Mother Baby Anesthesia Type: Spinal Level of consciousness: awake and alert Pain management: pain level controlled Vital Signs Assessment: post-procedure vital signs reviewed and stable Respiratory status: spontaneous breathing Cardiovascular status: stable Postop Assessment: no headache, spinal receding, patient able to bend at knees and no apparent nausea or vomiting Anesthetic complications: no Comments: Pain score 3.    Last Vitals:  Vitals:   10/08/17 0200 10/08/17 0300  BP:    Pulse:    Resp:    Temp:  37.1 C  SpO2: 98% 99%    Last Pain:  Vitals:   10/08/17 0700  TempSrc:   PainSc: 0-No pain   Pain Goal: Patients Stated Pain Goal: 3 (10/07/17 1750)               Rico Sheehan

## 2017-10-08 NOTE — Addendum Note (Signed)
Addendum  created 10/08/17 9935 by Garner Nash, CRNA   Sign clinical note

## 2017-10-09 NOTE — Progress Notes (Addendum)
Subjective: Postpartum Day 2 Cesarean Delivery Patient reports tolerating PO, + flatus and no problems voiding. Some increase in incisional pain.  No vision changes  Objective: Vital signs in last 24 hours: Temp:  [98.2 F (36.8 C)] 98.2 F (36.8 C) (12/01 0500) Pulse Rate:  [68-83] 78 (12/01 0500) Resp:  [18] 18 (12/01 0500) BP: (104-116)/(41-67) 116/66 (12/01 0500) SpO2:  [97 %] 97 % (11/30 0900)  Physical Exam:  General: alert and cooperative Lochia: appropriate Uterine Fundus: firm Incision: C/D/I   Recent Labs    10/07/17 1102 10/08/17 0530  HGB 11.0* 8.2*  HCT 34.3* 24.9*    Assessment/Plan: Status post Cesarean section. Doing well postoperatively.  Continue current care. Plan d/c tomorrow  Logan Bores 10/09/2017, 6:05 AM

## 2017-10-10 ENCOUNTER — Encounter (HOSPITAL_COMMUNITY): Payer: Self-pay | Admitting: Emergency Medicine

## 2017-10-10 MED ORDER — TRAMADOL HCL 50 MG PO TABS: 50.0000 mg | ORAL_TABLET | Freq: Four times a day (QID) | ORAL | 0 refills | Status: AC | PRN

## 2017-10-10 MED ORDER — IBUPROFEN 800 MG PO TABS: 800.0000 mg | ORAL_TABLET | Freq: Three times a day (TID) | ORAL | 0 refills | Status: AC

## 2017-10-10 NOTE — Discharge Summary (Signed)
OB Discharge Summary     Patient Name: Emily Rhodes DOB: April 23, 1986 MRN: [id number]  Date of admission: 10/07/2017 Delivering MD: Janyth Contes   Date of discharge: 10/10/2017  Admitting diagnosis: neurology, reccommends delivery via c-section  Intrauterine pregnancy: [redacted]w[redacted]d     Secondary diagnosis:  Principal Problem:   S/P cesarean section Active Problems:   Term pregnancy, repeat Tubal sterilization with c-section  Additional problems: MS flair prior to delivery     Discharge diagnosis: Term Pregnancy Delivered                                                                                                Post partum procedures:none  Complications: None  Hospital course:  Sceduled C/S   31 y.o. yo G2P1001 at [redacted]w[redacted]d was admitted to the hospital 10/07/2017 for scheduled cesarean section with the following indication:  Repeat c-section done at 37+ weeks for MS flair of optic neuritis.   Membrane Rupture Time/Date: 1:23 PM ,10/07/2017   Patient delivered a Viable infant.10/07/2017  Details of operation can be found in separate operative note.  Pateint had an uncomplicated postpartum course.  She is ambulating, tolerating a regular diet, passing flatus, and urinating well. Patient is discharged home in stable condition on  10/10/17         Physical exam  Vitals:   10/08/17 1835 10/09/17 0500 10/09/17 1900 10/10/17 0520  BP: (!) 104/41 116/66 138/78 123/75  Pulse: 68 78 95 87  Resp: 18 18 20 16   Temp: 98.2 F (36.8 C) 98.2 F (36.8 C) 98 F (36.7 C) 97.7 F (36.5 C)  TempSrc: Oral Oral Oral Oral  SpO2:    98%  Weight:      Height:       General: alert and cooperative Lochia: appropriate Uterine Fundus: firm Incision: Dressing is clean, dry, and intact  Labs: Lab Results  Component Value Date   WBC 12.5 (H) 10/08/2017   HGB 8.2 (L) 10/08/2017   HCT 24.9 (L) 10/08/2017   MCV 82.7 10/08/2017   PLT 198 10/08/2017   CMP Latest Ref Rng & Units 10/07/2017   Glucose 65 - 99 mg/dL 86  BUN 6 - 20 mg/dL 8  Creatinine 0.44 - 1.00 mg/dL 0.65  Sodium 135 - 145 mmol/L 134(L)  Potassium 3.5 - 5.1 mmol/L 4.5  Chloride 101 - 111 mmol/L 106  CO2 22 - 32 mmol/L 18(L)  Calcium 8.9 - 10.3 mg/dL 8.4(L)  Total Protein 6.5 - 8.1 g/dL 5.9(L)  Total Bilirubin 0.3 - 1.2 mg/dL 1.1  Alkaline Phos 38 - 126 U/L 108  AST 15 - 41 U/L 16  ALT 14 - 54 U/L 10(L)    Discharge instruction: per After Visit Summary and "Baby and Me Booklet".  After visit meds:  Allergies as of 10/10/2017      Reactions   Bee Venom Swelling   Percocet [oxycodone-acetaminophen] Nausea And Vomiting   Tape Itching, Rash   Plastic. Can use paper tape      Medication List    STOP taking these medications   acetaminophen 325 MG tablet Commonly known  as:  TYLENOL   ondansetron 8 MG disintegrating tablet Commonly known as:  ZOFRAN ODT   promethazine 25 MG tablet Commonly known as:  PHENERGAN     TAKE these medications   ferrous sulfate 325 (65 FE) MG EC tablet Take 325 mg 3 (three) times daily with meals by mouth.   ibuprofen 800 MG tablet Commonly known as:  ADVIL,MOTRIN Take 1 tablet (800 mg total) by mouth every 8 (eight) hours.   pantoprazole 20 MG tablet Commonly known as:  PROTONIX Take 1 tablet (20 mg total) by mouth daily.   traMADol 50 MG tablet Commonly known as:  ULTRAM Take 1 tablet (50 mg total) by mouth every 6 (six) hours as needed for moderate pain.       Diet: routine diet  Activity: Advance as tolerated. Pelvic rest for 6 weeks.   Outpatient follow up:2 weeks Follow up Appt:No future appointments. Follow up Visit:No Follow-up on file.  Postpartum contraception: Tubal Ligation  Newborn Data: Live born female  Birth Weight: 7 lb 6.3 oz (3355 g) APGAR: 34, 9  Newborn Delivery   Birth date/time:  10/07/2017 13:25:00 Delivery type:  C-Section, Low Transverse C-section categorization:  Repeat     Baby Feeding: Bottle Disposition:home  with mother  Pt has arranged f/u with neurology for this week.   10/10/2017 Logan Bores, MD

## 2017-10-10 NOTE — Progress Notes (Addendum)
Subjective: Postpartum Day 3  Cesarean Delivery Patient reports tolerating PO and no problems voiding.  Pain controlled with po meds Vision changes resolved  Objective: Vital signs in last 24 hours: Temp:  [97.7 F (36.5 C)-98 F (36.7 C)] 97.7 F (36.5 C) (12/02 0520) Pulse Rate:  [87-95] 87 (12/02 0520) Resp:  [16-20] 16 (12/02 0520) BP: (123-138)/(75-78) 123/75 (12/02 0520) SpO2:  [98 %] 98 % (12/02 0520)  Physical Exam:  General: alert and cooperative Lochia: appropriate Uterine Fundus: firm Incision: C/D/I   Recent Labs    10/07/17 1102 10/08/17 0530  HGB 11.0* 8.2*  HCT 34.3* 24.9*    Assessment/Plan: Status post Cesarean section. Doing well postoperatively.  Discharge home with standard precautions and return to clinic in 2 weeks. Has f/u with neurology this month.  Logan Bores 10/10/2017, 10:56 AM

## 2017-10-15 NOTE — Op Note (Signed)
Emily Rhodes, Emily Rhodes               ACCOUNT NO.:  [id number]  MEDICAL RECORD NO.:  [id number]  LOCATION:                                FACILITY:  [facility]  PHYSICIAN:  Thornell Sartorius, MD        DATE OF BIRTH:  [birth year]  DATE OF PROCEDURE:  10/07/2017 DATE OF DISCHARGE:  10/10/2017                              OPERATIVE REPORT   PREOPERATIVE DIAGNOSIS:  Intrauterine pregnancy at term.  Neurology recommending delivery via cesarean section due to worsening multiple sclerosis symptoms and undesired fertility.  POSTOPERATIVE DIAGNOSIS:  Intrauterine pregnancy at term. Neurology recommending delivery via cesarean section due to worsening multiple sclerosis symptoms and undesired fertility.  PROCEDURES:  Primary low transverse cesarean section with bilateral tubal ligation by bilateral salpingectomy.  SURGEON:  Thornell Sartorius, MD  ASSISTANT:  Joellyn Rued, RNFA.  ANESTHESIA:  Spinal.  FINDINGS:  Viable female infant at 55 with Apgars of 9 at one minute and 9 at five minutes and weight pending at the time of dictation. Normal uterus, tubes, and ovaries are noted.  ESTIMATED BLOOD LOSS:  Approximately 800 mL.  IV FLUIDS AND URINE OUTPUT:  Per Anesthesia note.  PATHOLOGY:  Placenta to L and D and bilateral tubal segments to Pathology.  DESCRIPTION OF PROCEDURE:  After informed consent was reviewed with the patient including risks, benefits, and alternatives of the surgical procedure, she was transported to the OR, placed on the table in supine position and after the spinal was found to be adequate, a Pfannenstiel skin incision was made at the level 2 fingerbreadths above the pubic symphysis, carried through the underlying layer of fascia sharply.  The fascia was incised in the midline and incision was extended laterally with Mayo scissors.  The superior aspect of the fascial incision was grasped with Kocher clamps and elevated.  The rectus muscles were dissected off both  bluntly and sharply.  Midline was easily identified and entered bluntly.  The Alexis skin retractor was placed carefully, making sure that no bowel was entrapped.  The uterus was explored and visualized easily.  A bladder flap was then created.  The uterus was incised in a transverse fashion and the infant was delivered from vertex presentation atraumatically.  Nose and mouth were suctioned on the field.  Cord was awaited a minute to be clamped and cut.  Infant was handed off to the awaiting Pediatric staff.  The uterus was cleared of all clot and debris after the placenta had been expressed.  The uterine incision was closed in 2 layers of 0 Monocryl, the first of which was running locked and the second as an imbricating layer.  The tubes were identified and isolated with a long Kelly, excised and doubly ligated with plain gut bilaterally.  Hemostasis was assured.  The Alexis retractor was removed.  The fascia was reapproximated with 0 Vicryl from either corner overlapping in the midline.  The peritoneum was approximated prior to the fascia with a 2-0 Vicryl.  Following closure of the peritoneum, the subfascial planes were inspected and they were found to be hemostatic.  The fascia was reapproximated from either corner overlapping in the midline.  The subcuticular adipose layer  was made hemostatic with Bovie cautery and the dead space was closed with plain gut.  The skin was closed with 4-0 Vicryl in a subcuticular fashion.  Benzoin and Steri-Strips were applied.  The patient tolerated the procedure well.  Sponge, lap, and needle counts were correct x2 per the operating room staff.     Thornell Sartorius, MD   ______________________________ Thornell Sartorius, MD    JB/MEDQ  D:  10/14/2017  T:  10/15/2017  Job:  [id number]

## 2017-10-18 ENCOUNTER — Encounter (HOSPITAL_COMMUNITY)
Admission: RE | Admit: 2017-10-18 | Discharge: 2017-10-18 | Disposition: A | Discharge status: Home / Self Care | Payer: No Typology Code available for payment source | Source: Ambulatory Visit

## 2020-02-22 ENCOUNTER — Other Ambulatory Visit: Payer: Self-pay | Admitting: Neurology

## 2020-02-22 DIAGNOSIS — G35 Multiple sclerosis: Secondary | ICD-10-CM

## 2020-03-06 ENCOUNTER — Ambulatory Visit
Admission: RE | Admit: 2020-03-06 | Discharge: 2020-03-06 | Disposition: A | Discharge status: Home / Self Care | Payer: PRIVATE HEALTH INSURANCE | Source: Ambulatory Visit | Attending: Neurology | Admitting: Neurology

## 2020-03-06 ENCOUNTER — Other Ambulatory Visit: Payer: Self-pay

## 2020-03-06 DIAGNOSIS — G35 Multiple sclerosis: Secondary | ICD-10-CM

## 2020-03-06 MED ORDER — GADOBUTROL 1 MMOL/ML IV SOLN
10.0000 mL | Freq: Once | INTRAVENOUS | Status: AC | PRN
  Administered 2020-03-06: 18:00:00 10 mL via INTRAVENOUS

## 2020-08-19 ENCOUNTER — Other Ambulatory Visit: Payer: Self-pay | Admitting: Neurology

## 2020-08-19 DIAGNOSIS — G35 Multiple sclerosis: Secondary | ICD-10-CM

## 2020-09-05 ENCOUNTER — Other Ambulatory Visit: Payer: Self-pay

## 2020-09-05 ENCOUNTER — Ambulatory Visit
Admission: RE | Admit: 2020-09-05 | Discharge: 2020-09-05 | Disposition: A | Discharge status: Home / Self Care | Payer: BC Managed Care – PPO | Source: Ambulatory Visit | Attending: Neurology | Admitting: Neurology

## 2020-09-05 DIAGNOSIS — G35 Multiple sclerosis: Secondary | ICD-10-CM | POA: Insufficient documentation

## 2020-09-05 MED ORDER — GADOBUTROL 1 MMOL/ML IV SOLN
10.0000 mL | Freq: Once | INTRAVENOUS | Status: AC | PRN
  Administered 2020-09-05: 10 mL via INTRAVENOUS

## 2020-09-24 ENCOUNTER — Other Ambulatory Visit (HOSPITAL_COMMUNITY): Payer: Self-pay | Admitting: Neurology

## 2020-09-24 DIAGNOSIS — G35 Multiple sclerosis: Secondary | ICD-10-CM

## 2020-10-10 ENCOUNTER — Ambulatory Visit: Payer: BC Managed Care – PPO

## 2023-01-17 ENCOUNTER — Emergency Department (HOSPITAL_BASED_OUTPATIENT_CLINIC_OR_DEPARTMENT_OTHER)
Admission: EM | Admit: 2023-01-17 | Discharge: 2023-01-17 | Disposition: A | Discharge status: Home / Self Care | Payer: BC Managed Care – PPO | Attending: Emergency Medicine | Admitting: Emergency Medicine

## 2023-01-17 ENCOUNTER — Other Ambulatory Visit: Payer: Self-pay

## 2023-01-17 ENCOUNTER — Emergency Department (HOSPITAL_BASED_OUTPATIENT_CLINIC_OR_DEPARTMENT_OTHER): Payer: BC Managed Care – PPO

## 2023-01-17 DIAGNOSIS — R42 Dizziness and giddiness: Secondary | ICD-10-CM | POA: Insufficient documentation

## 2023-01-17 DIAGNOSIS — R002 Palpitations: Secondary | ICD-10-CM | POA: Diagnosis not present

## 2023-01-17 LAB — CBC WITH DIFFERENTIAL/PLATELET
Abs Immature Granulocytes: 0.02 10*3/uL (ref 0.00–0.07)
Basophils Absolute: 0 10*3/uL (ref 0.0–0.1)
Basophils Relative: 1 %
Eosinophils Absolute: 0.2 10*3/uL (ref 0.0–0.5)
Eosinophils Relative: 3 %
HCT: 38.5 % (ref 36.0–46.0)
Hemoglobin: 12.4 g/dL (ref 12.0–15.0)
Immature Granulocytes: 0 %
Lymphocytes Relative: 27 %
Lymphs Abs: 2 10*3/uL (ref 0.7–4.0)
MCH: 25.7 pg — ABNORMAL LOW (ref 26.0–34.0)
MCHC: 32.2 g/dL (ref 30.0–36.0)
MCV: 79.9 fL — ABNORMAL LOW (ref 80.0–100.0)
Monocytes Absolute: 0.7 10*3/uL (ref 0.1–1.0)
Monocytes Relative: 9 %
Neutro Abs: 4.5 10*3/uL (ref 1.7–7.7)
Neutrophils Relative %: 60 %
Platelets: 297 10*3/uL (ref 150–400)
RBC: 4.82 MIL/uL (ref 3.87–5.11)
RDW: 14.1 % (ref 11.5–15.5)
WBC: 7.4 10*3/uL (ref 4.0–10.5)
nRBC: 0 % (ref 0.0–0.2)

## 2023-01-17 LAB — COMPREHENSIVE METABOLIC PANEL
ALT: 13 U/L (ref 0–44)
AST: 12 U/L — ABNORMAL LOW (ref 15–41)
Albumin: 3.9 g/dL (ref 3.5–5.0)
Alkaline Phosphatase: 96 U/L (ref 38–126)
Anion gap: 7 (ref 5–15)
BUN: 12 mg/dL (ref 6–20)
CO2: 27 mmol/L (ref 22–32)
Calcium: 9.3 mg/dL (ref 8.9–10.3)
Chloride: 105 mmol/L (ref 98–111)
Creatinine, Ser: 0.9 mg/dL (ref 0.44–1.00)
GFR, Estimated: >60 mL/min (ref >60)
Glucose, Bld: 87 mg/dL (ref 70–99)
Potassium: 3.8 mmol/L (ref 3.5–5.1)
Sodium: 139 mmol/L (ref 135–145)
Total Bilirubin: 0.3 mg/dL (ref 0.3–1.2)
Total Protein: 7.6 g/dL (ref 6.5–8.1)

## 2023-01-17 LAB — URINALYSIS, ROUTINE W REFLEX MICROSCOPIC
Bacteria, UA: NONE SEEN
Bilirubin Urine: NEGATIVE
Glucose, UA: NEGATIVE mg/dL
Ketones, ur: NEGATIVE mg/dL
Leukocytes,Ua: NEGATIVE
Nitrite: NEGATIVE
Protein, ur: NEGATIVE mg/dL
Specific Gravity, Urine: 1.009 (ref 1.005–1.030)
pH: 6 (ref 5.0–8.0)

## 2023-01-17 LAB — PREGNANCY, URINE: Preg Test, Ur: NEGATIVE

## 2023-01-17 MED ORDER — LACTATED RINGERS IV BOLUS
1000.0000 mL | Freq: Once | INTRAVENOUS | Status: AC
  Administered 2023-01-17: 1000 mL via INTRAVENOUS

## 2023-01-17 MED ORDER — MECLIZINE HCL 25 MG PO TABS: 25.0000 mg | ORAL_TABLET | Freq: Three times a day (TID) | ORAL | 0 refills | Status: AC | PRN

## 2023-01-17 NOTE — ED Provider Notes (Signed)
[facility] Provider Note   CSN: [id number] Arrival date & time: 01/17/23  1828     History  Chief Complaint  Patient presents with   Multiple Sclerosis   Dizziness   Palpitations    Emily Rhodes is a 37 y.o. female.  Patient is a 37 year old female with a history of MS who has been off medication for approximately 8 months now due to inability to afford them who is presenting today with multiple symptoms.  Patient reports with her MS she has good days and bad days.  Usually her bad days include sometimes having some memory issues, generalized slowness and fatigue.  She reports yesterday was a bad day but today her symptoms have been different.  She reports that today while she was sitting in church she started feeling very flushed, dizzy and like things were spinning but also seeing white dots in her vision like she might pass out and feeling like her heart was racing.  Her face would become flushed and this would last for 10 to 15 minutes sometimes less and then go away.  The dizziness did seem to be worse if she tried to get up and move around.  She did not have any chest pain or shortness of breath.  She reports she was wearing her Apple Watch and at 1 point it said her heart rate went up to 160.  She reports feeling like her heart is beating a little bit fast now and feeling hot on the inside but denies feeling flushed.  She has not had any nausea or vomiting but reports she has not had an appetite today.  No urinary changes.  No unilateral numbness weakness, facial tingling, speech issues.  She only takes citalopram and has not started any new medications recently.  She denies having symptoms exactly like this in the past which is why she came because she was worried.  She does not have a headache or vision changes.  The history is provided by the patient and a friend.  Dizziness Associated symptoms: palpitations   Palpitations Associated  symptoms: dizziness        Home Medications Prior to Admission medications   Medication Sig Start Date End Date Taking? Authorizing Provider  meclizine (ANTIVERT) 25 MG tablet Take 1 tablet (25 mg total) by mouth 3 (three) times daily as needed for dizziness. 01/17/23  Yes Blanchie Dessert, MD  ferrous sulfate 325 (65 FE) MG EC tablet Take 325 mg 3 (three) times daily with meals by mouth.    [provider]  ibuprofen (ADVIL,MOTRIN) 800 MG tablet Take 1 tablet (800 mg total) by mouth every 8 (eight) hours. 10/10/17   [name], MD  pantoprazole (PROTONIX) 20 MG tablet Take 1 tablet (20 mg total) by mouth daily. 10/01/17   Tamala Julian, [place], CNM  traMADol (ULTRAM) 50 MG tablet Take 1 tablet (50 mg total) by mouth every 6 (six) hours as needed for moderate pain. 10/10/17   [name], MD      Allergies    Bee venom, Percocet [oxycodone-acetaminophen], and Tape    Review of Systems   Review of Systems  Cardiovascular:  Positive for palpitations.  Neurological:  Positive for dizziness.    Physical Exam Updated Vital Signs BP 125/88   Pulse 84   Temp 98.4 F (36.9 C) (Oral)   Resp 18   Ht '5\' 5"'$  (1.651 m)   Wt 108.4 kg   SpO2 99%   BMI 39.77  kg/m  Physical Exam Vitals and nursing note reviewed.  Constitutional:      General: She is in acute distress.     Appearance: She is well-developed.  HENT:     Head: Normocephalic and atraumatic.  Eyes:     General: No visual field deficit.    Pupils: Pupils are equal, round, and reactive to light.  Cardiovascular:     Rate and Rhythm: Normal rate and regular rhythm.     Heart sounds: Normal heart sounds. No murmur heard.    No friction rub.  Pulmonary:     Effort: Pulmonary effort is normal.     Breath sounds: Normal breath sounds. No wheezing or rales.  Abdominal:     General: Bowel sounds are normal. There is no distension.     Palpations: Abdomen is soft.     Tenderness: There is no abdominal tenderness.  There is no guarding or rebound.  Musculoskeletal:        General: No tenderness. Normal range of motion.     Right lower leg: No edema.     Left lower leg: No edema.     Comments: No edema  Skin:    General: Skin is warm and dry.     Findings: No rash.  Neurological:     Mental Status: She is alert and oriented to person, place, and time.     Cranial Nerves: No cranial nerve deficit, dysarthria or facial asymmetry.     Sensory: Sensation is intact.     Motor: Motor function is intact. No weakness or pronator drift.     Coordination: Coordination is intact. Finger-Nose-Finger Test and Heel to [place] Test normal.     Comments: Nystagmus noted when looking to the right and reports slight dizziness when looking to the right  Psychiatric:        Behavior: Behavior normal.     ED Results / Procedures / Treatments   Labs (all labs ordered are listed, but only abnormal results are displayed) Labs Reviewed  CBC WITH DIFFERENTIAL/PLATELET - Abnormal; Notable for the following components:      Result Value   MCV 79.9 (*)    MCH 25.7 (*)    All other components within normal limits  COMPREHENSIVE METABOLIC PANEL - Abnormal; Notable for the following components:   AST 12 (*)    All other components within normal limits  URINALYSIS, ROUTINE W REFLEX MICROSCOPIC - Abnormal; Notable for the following components:   Color, Urine COLORLESS (*)    Hgb urine dipstick TRACE (*)    All other components within normal limits  PREGNANCY, URINE    EKG EKG Interpretation  Date/Time:  Sunday January 17 2023 18:43:36 EDT Ventricular Rate:  91 PR Interval:  150 QRS Duration: 85 QT Interval:  382 QTC Calculation: 470 R Axis:   36 Text Interpretation: Sinus rhythm Low voltage, precordial leads No previous tracing Confirmed by Blanchie Dessert [phone]) on 01/17/2023 7:22:50 PM  Radiology CT Head Wo Contrast  Result Date: 01/17/2023 CLINICAL DATA:  Dizziness EXAM: CT HEAD WITHOUT CONTRAST TECHNIQUE:  Contiguous axial images were obtained from the base of the skull through the vertex without intravenous contrast. RADIATION DOSE REDUCTION: This exam was performed according to the departmental dose-optimization program which includes automated exposure control, adjustment of the mA and/or kV according to patient size and/or use of iterative reconstruction technique. COMPARISON:  MRI brain 09/05/2020 FINDINGS: Brain: No evidence of acute infarction, hemorrhage, hydrocephalus, extra-axial collection or mass lesion/mass effect. Vascular:  No hyperdense vessel or unexpected calcification. Skull: Normal. Negative for fracture or focal lesion. Sinuses/Orbits: No acute finding. Other: None. IMPRESSION: No acute intracranial pathology. Electronically Signed   By: Lovey Newcomer M.D.   On: 01/17/2023 19:58   DG Chest Port 1 View  Result Date: 01/17/2023 CLINICAL DATA:  Chest pain.  Palpitations. EXAM: PORTABLE CHEST 1 VIEW COMPARISON:  None Available. FINDINGS: The heart size and mediastinal contours are within normal limits. Both lungs are clear. The visualized skeletal structures are unremarkable. IMPRESSION: No active disease. Electronically Signed   By: Lovey Newcomer M.D.   On: 01/17/2023 19:24    Procedures Procedures    Medications Ordered in ED Medications  lactated ringers bolus 1,000 mL (1,000 mLs Intravenous New Bag/Given 01/17/23 1926)    ED Course/ Medical Decision Making/ A&P                             Medical Decision Making Amount and/or Complexity of Data Reviewed Independent Historian: friend Labs: ordered. Decision-making details documented in ED Course. Radiology: ordered and independent interpretation performed. Decision-making details documented in ED Course. ECG/medicine tests: ordered and independent interpretation performed. Decision-making details documented in ED Course.  Risk Prescription drug management.   Pt with multiple medical problems and comorbidities and presenting  today with a complaint that caries a high risk for morbidity and mortality.  Here today with complaints of palpitations and dizziness.  Symptoms could be a result of an MS flare as patient is not currently on any medications however also concern for possible dehydration, electrolyte abnormality.  Low suspicion for pregnancy as patient has had a tubal ligation she denies any infectious symptoms.  Also could be benign positional vertigo.  Her friend to give her meclizine earlier today which did help some with the dizziness.  Patient does not have any known heart issues has never had dysrhythmias in the past.  Patient is on continuous cardiac monitoring at this time and currently heart rates are less than 100.  She is PERC negative low suspicion for PE at this time. 8:38 PM I independently interpreted patient's labs and EKG.  EKG within normal limits today.  UA, urine pregnancy, CBC, CMP all without acute findings.  I have independently visualized and interpreted pt's images today.  CT of the head without acute findings including no intracranial hemorrhage.  Chest x-ray within normal limits.  Patient has been on continuous cardiac monitoring for several hours now without evidence of dysrhythmia.  Patient was able to stand and walk to the bathroom and reports she felt okay.  Some slight dizziness but overall is feeling better.  Discussed with the patient and her friend who is present at bedside that cannot rule out an MS flare and to confirm that she would need an MRI and I can consult neurology.  Gust with patient I could not find any specific cause for her to have the facial flushing and may be an autonomic response to her MS.  No signs of dysrhythmia or acute cardiac etiology at this point.  Patient reports she does not desire to go to [facility] tonight or to have an MRI but would prefer to follow-up with her neurologist tomorrow.  She would like to go home.  She understands that things could get worse and this could be  related to her MS.  She was also told that if she develop chest pain or shortness of breath she would need to  return for evaluation.  She is comfortable with this plan.  She was given a prescription for meclizine.         Final Clinical Impression(s) / ED Diagnoses Final diagnoses:  Palpitations  Vertigo    Rx / DC Orders ED Discharge Orders          Ordered    meclizine (ANTIVERT) 25 MG tablet  3 times daily PRN        01/17/23 2038              Blanchie Dessert, MD 01/17/23 2038

## 2023-01-17 NOTE — Discharge Instructions (Addendum)
All your testing today was reassuring however we cannot rule out that this could be related to your MS.  Everything with your heart and lungs looks okay today but if you develop chest pain, persistently elevated heart rate or shortness of breath you should return to the emergency room.

## 2023-01-17 NOTE — ED Triage Notes (Signed)
Patient arrives with complaints of palpitations and dizziness x1 day. Patient states that she has note her heart rate is elevating to the high 160's.   Patient reports a history of MS.

## 2023-01-17 NOTE — ED Notes (Signed)
RN reviewed discharge instructions w/ pt. Follow up and medications reviewed, pt had no further questions °

## 2023-01-17 NOTE — ED Notes (Signed)
Patient transported to CT 

## 2023-02-10 NOTE — Progress Notes (Unsigned)
[facility]  PATIENT: Emily Rhodes DOB: [birth year]  REFERRING DOCTOR OR PCP:  Dionicia Abler, PA-C  SOURCE: patiet, notes from [facility] and [facility] and PCP, imaging and lab results, Multiple MRI images personally reviewed  _________________________________   HISTORICAL  CHIEF COMPLAINT:  Chief Complaint  Patient presents with   Room 11    Pt is here with her best friend. Pt states that 3 weeks ago she had a lot of fatigue. Pt states that her heart felt like her heart was racing and then she went to the hospital. Pt states that her speech has been affected for going on 2 weeks now.     HISTORY OF PRESENT ILLNESS:  I had the pleasure of seeing your patient, Emily Rhodes, at [facility] for neurologic consultation regarding her visual and speech issues, fatigue and past diagnosis of MS.  She is a 37 year old woman currently reporting issues with speech for the past month but followed an episode of extreme fatigue.  In the past, she was treated for MS with Tecfidera and then Gilenya.  She has been off of any disease modifying therapy since mid 2023  In 2010, she had the onset of right eye visual changes with blackening of the visual field.  She also reported pain with eye movements.  She saw ophthalmology and was diagnosed with optic neuritis.  She saw Dr. Emilio Math at [facility]     neuro-ophthalmology.  Vision improved but not to baseline - still felt there was reduced vision at the edge.  MRI of the brain showed a couple nonspecific punctate T2/FLAIR hypertense foci.  CSF examination did not show oligoclonal bands.  In 2013 she was referred to Dr. Moshe Cipro at [facility] neurology.Marland Kitchen  He did additional testing including additional MRI and somatosensory evoked potentials.  The MRI was unchanged and the SSEP was normal.    He told her he believes there was a less than 50% chance that she had MS.  She began to see Dr. Manuella Ghazi in [place] 2015.  He went ahead and started  Tecfidera.  Over the ensuing years, she would note fluctuating visual symptoms.    VF testing showed normal left VF.    Right VF (her friend had pictures on hone of report). had different patterns (reduced lower field, constricted all quadrants with preserved central).   She had some trouble tolerating Tecfidera and was switched to [place] 01/23/2015.  She has since had multiple MRIs of the brain.  They continue to show a fairly stable pattern of minimal punctate T2/FLAIR hypertense foci.  By my review, these are completely nonspecific.  She saw Dr. Serita Butcher at [facility] 01/06/2021.  He also felt that she did not have multiple sclerosis.  In 2020, she had right leg numbness and weakess and used a cane x 4 months.  MRI of the brain and cervical and thoracic spine at [facility] did not show any new lesions.  She states she still has a foot drop when tired.   March 2024, she became very tired and then she started to have slowed speech and then a stutter.  She notes it is gradually improving.   Recently,  sometimes notes difficulty coming up with the right word.      She is otherwise physically healthy.   She is overweight with a BMI of 46.  She is on Lexapro for anxiety and depression..   No diagnosis of Bipolar or PTSD.     She reports headaches that have bothered  her off-and-on for years.  Some of the headaches are associated with nausea vomiting and visual changes.  Bright lights bother her.  These are currently occurring most days.  She sleeps well.  She has fatigue.   She snores and has some EDS.  She has no other OSA sign - no gasping, snorting, pauses at night.     EPWORTH SLEEPINESS SCALE  On a scale of 0 - 3 what is the chance of dozing:  Sitting and Reading:   0 Watching TV:    3 Sitting inactive in a public place: 0 Passenger in car for one hour: 3 Lying down to rest in the afternoon: 3 Sitting and talking to someone: 0 Sitting quietly after lunch:  3 In a car, stopped in traffic:  0  Total (out  of 24):    12/24    Multiple MRIs were reviewed. MRI of the brain 05/01/2014, 03/06/2020 and 09/05/2020 were reviewed.  These just show a couple punctate T2/FLAIR hyperintense foci in the hemispheres.  They are small round and nonspecific.  MRI of the cervical spine 05/25/2015 and 03/06/2020 was reviewed.  The spinal cord was normal.  There were minimal disc bulges at C4-C5 and C5-C6 not leading to spinal stenosis or nerve root compression.  Laboratory: CSF 08/23/2009 showed no oligoclonal bands.  Protein, glucose and cell count was normal. Blood work 08/20/2009 showed normal sed rate.  ANA screen was positive.  ACE was negative, RPR was negative Blood work 12/29/2012 showed mild vitamin D deficiency with level 24 ng/mL Blood work 04/27/2014 showed negative [facility] [place] spotted fever titers  REVIEW OF SYSTEMS: Constitutional: No fevers, chills, sweats, or change in appetite Eyes: As above Ear, nose and throat: No hearing loss, ear pain, nasal congestion, sore throat Cardiovascular: No chest pain, palpitations Respiratory:  No shortness of breath at rest or with exertion.   No wheezes.  She snores GastrointestinaI: No nausea, vomiting, diarrhea, abdominal pain, fecal incontinence Genitourinary:  No dysuria, urinary retention or frequency.  No nocturia. Musculoskeletal:  No neck pain, back pain Integumentary: No rash, pruritus, skin lesions Neurological: as above Psychiatric: Notes anxiety and depression at times Endocrine: No palpitations, diaphoresis, change in appetite, change in weigh or increased thirst Hematologic/Lymphatic:  No anemia, purpura, petechiae. Allergic/Immunologic: No itchy/runny eyes, nasal congestion, recent allergic reactions, rashes  ALLERGIES: Allergies  Allergen Reactions   Bee Venom Swelling   Percocet [Oxycodone-Acetaminophen] Nausea And Vomiting   Tape Itching and Rash    Plastic. Can use paper tape    HOME MEDICATIONS:  Current Outpatient  Medications:    escitalopram (LEXAPRO) 20 MG tablet, Take 20 mg by mouth daily., Disp: , Rfl:    ibuprofen (ADVIL,MOTRIN) 800 MG tablet, Take 1 tablet (800 mg total) by mouth every 8 (eight) hours., Disp: 30 tablet, Rfl: 0   levETIRAcetam (KEPPRA) 500 MG tablet, Take 1 tablet (500 mg total) by mouth 2 (two) times daily., Disp: 60 tablet, Rfl: 11   meclizine (ANTIVERT) 25 MG tablet, Take 1 tablet (25 mg total) by mouth 3 (three) times daily as needed for dizziness., Disp: 30 tablet, Rfl: 0   omeprazole (PRILOSEC) 20 MG capsule, Take 20 mg by mouth daily., Disp: , Rfl:    ferrous sulfate 325 (65 FE) MG EC tablet, Take 325 mg 3 (three) times daily with meals by mouth. (Patient not taking: Reported on 02/11/2023), Disp: , Rfl:    pantoprazole (PROTONIX) 20 MG tablet, Take 1 tablet (20 mg total) by mouth  daily. (Patient not taking: Reported on 02/11/2023), Disp: 30 tablet, Rfl: 2   traMADol (ULTRAM) 50 MG tablet, Take 1 tablet (50 mg total) by mouth every 6 (six) hours as needed for moderate pain. (Patient not taking: Reported on 02/11/2023), Disp: 30 tablet, Rfl: 0  PAST MEDICAL HISTORY: Past Medical History:  Diagnosis Date   Fibroid    Infection    UTI   Multiple sclerosis    Optic neuritis    rt eye   Term pregnancy, repeat 10/05/2017    PAST SURGICAL HISTORY: Past Surgical History:  Procedure Laterality Date   CESAREAN SECTION WITH BILATERAL TUBAL LIGATION N/A 10/07/2017   Procedure: PRIMARY CESAREAN SECTION WITH BILATERAL TUBAL LIGATION;  Surgeon: Janyth Contes, MD;  Location: [place];  Service: Obstetrics;  Laterality: N/A;  MD request RNFA   spinal tap     WISDOM TOOTH EXTRACTION     age 34    FAMILY HISTORY: Family History  Problem Relation Age of Onset   Diabetes Father    Hypertension Father    Hearing loss Neg Hx     SOCIAL HISTORY: Social History   Socioeconomic History   Marital status: Married    Spouse name: Not on file   Number of children: Not  on file   Years of education: Not on file   Highest education level: Not on file  Occupational History   Not on file  Tobacco Use   Smoking status: Never   Smokeless tobacco: Never  Substance and Sexual Activity   Alcohol use: No   Drug use: No   Sexual activity: Yes  Other Topics Concern   Not on file  Social History Narrative   Right Handed    Social Determinants of Health   Financial Resource Strain: Not on file  Food Insecurity: Not on file  Transportation Needs: Not on file  Physical Activity: Not on file  Stress: Not on file  Social Connections: Not on file  Intimate Partner Violence: Not on file       PHYSICAL EXAM  Vitals:   02/11/23 0855  BP: (!) 147/92  Pulse: 94  Weight: 278 lb (126.1 kg)  Height: 5\' 5"  (1.651 m)    Body mass index is 46.26 kg/m.   General: The patient is well-developed and well-nourished and in no acute distress.  Pharynx is Mallampati 3  HEENT:  Head is [place]/AT.  Sclera are anicteric.  Funduscopic exam shows normal optic discs and retinal vessels.  Neck: No carotid bruits are noted.  The neck is nontender.  Cardiovascular: The heart has a regular rate and rhythm with a normal S1 and S2. There were no murmurs, gallops or rubs.    Skin: Extremities are without rash or  edema.  Musculoskeletal:  Back is nontender  Neurologic Exam  Mental status: The patient is alert and oriented x 3 at the time of the examination. The patient has apparent normal recent and remote memory, with an apparently normal attention span and concentration ability.   Speech is normal.  Cranial nerves: Extraocular movements are full. She had a full VF on simple testing.  Colors were asymmetric, brighter on RIGHT.  Pupils are equal, round, and reactive to light and accomodation.  Facial symmetry is present. There is good facial sensation to soft touch bilaterally.Facial strength is normal.  Trapezius and sternocleidomastoid strength is normal.  The tongue is  midline, and the patient has symmetric elevation of the soft palate. No obvious hearing deficits are  noted.  Speech: She has a modulating speech pattern that is distractible and has normal speech at times.  No aphasia was noted.  Motor:  Muscle bulk is normal.   Tone is normal. Strength is  5 / 5 in all 4 extremities.   Sensory: Sensory testing is intact to pinprick, soft touch and vibration sensation in all 4 extremities except for mild asymmetry of temperature sensation in the arms..  Coordination: Cerebellar testing reveals good finger-nose-finger and heel-to-shin bilaterally.  Gait and station: Station is normal.   Gait is normal. Tandem gait is normal. Romberg is negative.   Reflexes: Deep tendon reflexes are symmetric and normal bilaterally.   Plantar responses are flexor.    DIAGNOSTIC DATA (LABS, IMAGING, TESTING) - I reviewed patient records, labs, notes, testing and imaging myself where available.  Lab Results  Component Value Date   WBC 7.4 01/17/2023   HGB 12.4 01/17/2023   HCT 38.5 01/17/2023   MCV 79.9 (L) 01/17/2023   PLT 297 01/17/2023      Component Value Date/Time   NA 139 01/17/2023 1928   K 3.8 01/17/2023 1928   CL 105 01/17/2023 1928   CO2 27 01/17/2023 1928   GLUCOSE 87 01/17/2023 1928   BUN 12 01/17/2023 1928   CREATININE 0.90 01/17/2023 1928   CALCIUM 9.3 01/17/2023 1928   PROT 7.6 01/17/2023 1928   ALBUMIN 3.9 01/17/2023 1928   AST 12 (L) 01/17/2023 1928   ALT 13 01/17/2023 1928   ALKPHOS 96 01/17/2023 1928   BILITOT 0.3 01/17/2023 1928   GFRNONAA >60 01/17/2023 1928   GFRAA >60 10/07/2017 1102       ASSESSMENT AND PLAN  History of optic neuritis - Plan: Anti-MOG, Serum, Neuromyelitis optica autoab, IgG, Angiotensin converting enzyme, MR BRAIN W WO CONTRAST  White matter abnormality on MRI of brain - Plan: Anti-MOG, Serum, Neuromyelitis optica autoab, IgG, MR BRAIN W WO CONTRAST  Slurred speech  Snoring  Excessive daytime  sleepiness  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 Grabenstein is a 37 year old woman who is currently experiencing slurred speech that follow a 1 week episode of extreme weakness.  This is in the setting of a questionable diagnosis of MS.  Reviewing her history and the MRIs, I feel the likelihood that she has MS is extremely low.    Because of the new symptoms and the passage of 3 more years, I would like to check an MRI of the brain to make sure that there has not been any significant change that might change this opinion.  Her speech pattern is functional.  She denies any significant stressor or history of PTSD.  She does report some anxiety.  I discussed with her that most of the time with patterns of speech like hers there is a psychogenic etiology and she could be referred to behavioral health if she would like.  The initial history of optic neuritis seems well-documented and she saw a neuro-ophthalmologist at [facility].  I discussed with her that 3 out of 4 times when a person has optic neuritis and a fairly normal MRI as hers was, the event will be monophasic though 1 out of 4 times it could be the first sign of MS.  However, since an additional 10 years of unchanged MRIs have occurred, the likelihood of MS is very small.  Because of her history of optic neuritis, we need to check anti-MOG, anti-NMO and angiotensin-converting enzyme.  She does report migraines.  Because she is having some slurred speech and fatigue, I will have her try levetiracetam first instead of Topamax.  If she does not tolerate the levetiracetam, we could reconsider Topamax or other migraine therapy.  She reports fatigue and she also has mild excessive sleepiness.  She snores but has not been noted to have other OSA signs.  If the sleepiness worsens I recommend that she have a sleep study.  Based on the results of the studies I may need to see her back.  Otherwise she could call if she has  significant new or worsening symptoms.  If studies are normal, it would be most likely that these events would be psychogenic and referral to psychiatry may be indicated.    Dechelle Attaway A. Felecia Shelling, MD, [facility] [id number], [id number] AM Certified in Neurology, Clinical Neurophysiology, Sleep Medicine and Neuroimaging  [facility] [address] [place], [place] [address] [phone]

## 2023-02-11 ENCOUNTER — Other Ambulatory Visit: Payer: Self-pay | Admitting: Neurology

## 2023-02-11 ENCOUNTER — Encounter: Payer: Self-pay | Admitting: Neurology

## 2023-02-11 ENCOUNTER — Ambulatory Visit: Payer: BC Managed Care – PPO | Admitting: Neurology

## 2023-02-11 VITALS — BP 147/92 | HR 94 | Ht 65.0 in | Wt 278.0 lb

## 2023-02-11 DIAGNOSIS — R4781 Slurred speech: Secondary | ICD-10-CM

## 2023-02-11 DIAGNOSIS — G4719 Other hypersomnia: Secondary | ICD-10-CM

## 2023-02-11 DIAGNOSIS — Z8669 Personal history of other diseases of the nervous system and sense organs: Secondary | ICD-10-CM | POA: Diagnosis not present

## 2023-02-11 DIAGNOSIS — R0683 Snoring: Secondary | ICD-10-CM | POA: Diagnosis not present

## 2023-02-11 DIAGNOSIS — F444 Conversion disorder with motor symptom or deficit: Secondary | ICD-10-CM

## 2023-02-11 DIAGNOSIS — R9082 White matter disease, unspecified: Secondary | ICD-10-CM | POA: Diagnosis not present

## 2023-02-11 MED ORDER — LEVETIRACETAM 500 MG PO TABS: 500.0000 mg | ORAL_TABLET | Freq: Two times a day (BID) | ORAL | 11 refills | Status: AC

## 2023-02-11 MED ORDER — ALPRAZOLAM 1 MG PO TABS: ORAL_TABLET | ORAL | 0 refills | Status: AC

## 2023-02-16 LAB — ANTI-MOG, SERUM: MOG Antibody, Cell-based IFA: NEGATIVE

## 2023-02-16 LAB — NEUROMYELITIS OPTICA AUTOAB, IGG: NMO IgG Autoantibodies: <1.5 U/mL (ref 0.0–3.0)

## 2023-02-16 LAB — ANGIOTENSIN CONVERTING ENZYME: Angio Convert Enzyme: 25 U/L (ref 14–82)

## 2023-02-23 ENCOUNTER — Ambulatory Visit (INDEPENDENT_AMBULATORY_CARE_PROVIDER_SITE_OTHER): Payer: BC Managed Care – PPO

## 2023-02-23 DIAGNOSIS — Z8669 Personal history of other diseases of the nervous system and sense organs: Secondary | ICD-10-CM | POA: Diagnosis not present

## 2023-02-23 DIAGNOSIS — R9082 White matter disease, unspecified: Secondary | ICD-10-CM

## 2023-02-23 MED ORDER — GADOBENATE DIMEGLUMINE 529 MG/ML IV SOLN
20.0000 mL | Freq: Once | INTRAVENOUS | Status: AC | PRN
  Administered 2023-02-23: 20 mL via INTRAVENOUS
